# Patient Record
Sex: Female | Born: 1985 | Race: White | Hispanic: No | Smoking: Former smoker
Health system: Southern US, Community
[De-identification: ages and names within clinical notes are randomized; demographics above are authoritative.]

## PROBLEM LIST (undated history)

## (undated) DIAGNOSIS — F419 Anxiety disorder, unspecified: Secondary | ICD-10-CM

## (undated) DIAGNOSIS — F32A Depression, unspecified: Secondary | ICD-10-CM

## (undated) DIAGNOSIS — F329 Major depressive disorder, single episode, unspecified: Secondary | ICD-10-CM

## (undated) DIAGNOSIS — R51 Headache: Secondary | ICD-10-CM

## (undated) HISTORY — DX: Headache: R51

## (undated) HISTORY — DX: Anxiety disorder, unspecified: F41.9

## (undated) HISTORY — DX: Major depressive disorder, single episode, unspecified: F32.9

## (undated) HISTORY — DX: Depression, unspecified: F32.A

---

## 2014-01-10 LAB — HM PAP SMEAR: HM Pap smear: NEGATIVE

## 2014-01-14 ENCOUNTER — Encounter (INDEPENDENT_AMBULATORY_CARE_PROVIDER_SITE_OTHER): Payer: Self-pay

## 2014-01-14 ENCOUNTER — Encounter (HOSPITAL_COMMUNITY): Payer: Self-pay | Admitting: Psychiatry

## 2014-01-14 ENCOUNTER — Ambulatory Visit (INDEPENDENT_AMBULATORY_CARE_PROVIDER_SITE_OTHER): Payer: 59 | Admitting: Psychiatry

## 2014-01-14 VITALS — BP 121/72 | HR 78 | Ht 66.0 in | Wt 160.0 lb

## 2014-01-14 DIAGNOSIS — F329 Major depressive disorder, single episode, unspecified: Secondary | ICD-10-CM | POA: Insufficient documentation

## 2014-01-14 DIAGNOSIS — F419 Anxiety disorder, unspecified: Secondary | ICD-10-CM

## 2014-01-14 DIAGNOSIS — F32A Depression, unspecified: Secondary | ICD-10-CM | POA: Insufficient documentation

## 2014-01-14 DIAGNOSIS — F341 Dysthymic disorder: Secondary | ICD-10-CM

## 2014-01-14 DIAGNOSIS — F331 Major depressive disorder, recurrent, moderate: Secondary | ICD-10-CM | POA: Insufficient documentation

## 2014-01-14 MED ORDER — ESCITALOPRAM OXALATE 10 MG PO TABS: 15.0000 mg | ORAL_TABLET | Freq: Every day | ORAL | Status: DC
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
## 2014-01-15 ENCOUNTER — Telehealth (HOSPITAL_COMMUNITY): Payer: Self-pay

## 2014-01-15 NOTE — Telephone Encounter (Signed)
Called informed for a 30 day supply on the med.

## 2014-01-30 ENCOUNTER — Ambulatory Visit (HOSPITAL_COMMUNITY): Payer: 59 | Admitting: Professional Counselor

## 2014-02-11 ENCOUNTER — Ambulatory Visit (HOSPITAL_COMMUNITY): Payer: 59 | Admitting: Psychiatry

## 2014-02-17 ENCOUNTER — Encounter (HOSPITAL_COMMUNITY): Payer: Self-pay | Admitting: Professional Counselor

## 2014-02-17 ENCOUNTER — Ambulatory Visit (INDEPENDENT_AMBULATORY_CARE_PROVIDER_SITE_OTHER): Payer: 59 | Admitting: Professional Counselor

## 2014-02-17 DIAGNOSIS — F331 Major depressive disorder, recurrent, moderate: Secondary | ICD-10-CM

## 2014-02-17 DIAGNOSIS — F411 Generalized anxiety disorder: Secondary | ICD-10-CM

## 2014-02-17 NOTE — Progress Notes (Signed)
Patient:   Suzanne Stephenson   DOB:   09-15-1985  MR Number:  [id number]  Location:  [place] [address] Dept: [phone]           Date of Service:   02/17/2014   Start Time:   10:00am End Time:   10:50am  Provider/Observer:  [place] [employer]       Billing Code/Service: [phone]  Chief Complaint:     Chief Complaint  Patient presents with  . Anxiety  . Establish Care    Reason for Service:  Patient was referred by her psychiatrist due to increased deperssion, anxiety, and stress.  Patient also reports history of ADHD.  Stressors include past events such as her divorce, loss of Aunt and grandmother, work-related stress, and family relational issues.  She reports she has had panic attacks in the past, but they have decreased to 1-2 per week.  She reports other behavioral responses include poor concentration, poor focus, loss of thoughts/train of thought, and poor sleep. She is seeking to establish therapy services at this time.  Current Status:  Suzanne Stephenson reports she has improved mood and stability due to medication adjustments and compliance. She reports she is neither unhappy or happy, but reports she is just going through the emotions and has learned to suppress/turn her emotions off and shut down. She reports this is not how she wants to live and wanting to regulate her emotions.  Reliability of Information: Self report, chart review  Behavioral Observation: Suzanne Stephenson  presents as a 5 y.o.-year-old Right Caucasian Female who appeared her stated age. her dress was Appropriate and she was Casual and her manners were Appropriate to the situation.  There were not any physical disabilities noted.  she displayed an appropriate level of cooperation and motivation.    Interactions:    Active   Attention:   abnormal - patient lost her thoughts several  times during assessment.  She reports she would forget what she was saying and change the subject.  Memory:   normal  Visuo-spatial:   normal  Speech (Volume):  normal  Speech:   normal pitch and normal volume  Thought Process:  Loose  Though Content:  WNL  Orientation:   person, place, time/date, situation and day of week  Judgment:   Good  Planning:   Fair  Affect:    Flat  Mood:    Depressed  Insight:   Good  Intelligence:   normal  Marital Status/Living: Patient reports she was married very young at the age of 61. She was married for three years and husband at the time was diagnosis ed with MS. She reports she was his caregiver up until he started seeing another woman and left patient. Patient divorced, was able to live independently for over a year until she met her current boyfriend.  She reports she is in a healthy relationship, lives with boyfriend in [place] and has 2 step children: 49 years old and 12 years old.  She reports she plans on getting married to current boyfriend, but at this time they are not ready.  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   Substance Use:  No concerns of substance abuse are reported.  Patient reports she smokes cigarettes daily, reports 4 or more a day and this helps  her anxiety. She reports she has tried marijuana in the past, however nothing current. Has drank alcohol in the past, will drink one alcoholic beverage a week, but no past history of substance abuse problems or abuse.     Patient reports her sister (youngest sister) has been in and out of rehab 4 or more times for a drug abuse problem. Patient was again the caregiver (emotional support) for sister through the duration of her drug problem. Sister is currently sober at this time.  Education:   HS Graduate and Completed [employer] school.  Hobbies:  Reading, being with friends,  going to the beach.  Legal: none  Religion: not affiliated  Military: None reported.  Medical History:   Past Medical History  Diagnosis Date  . Anxiety   . Depression   . YIFOYDXA(128.7)         Outpatient Encounter Prescriptions as of 02/17/2014  Medication Sig  . ALPRAZolam (XANAX) 0.5 MG tablet Take 0.5 mg by mouth.  . escitalopram (LEXAPRO) 10 MG tablet Take 1.5 tablets (15 mg total) by mouth daily.        Reports medications have helped her current anxiety and depression. Patient may benefit from a small stimulant to help the ADD (inattention type) AEB in session she lost her thoughts very easily, struggled focusing, struggled with concentration, sleep.  Hyperactivity was not noticed at this time as patient was calm and able to complete hour long assessment. She reports being overstimulated, not wanting to be around increased noise or people and being exhausted at the end of the day.   Sexual History:   History  Sexual Activity  . Sexual Activity: Yes    Abuse/Trauma History: None reported by patient within childhood or as and adult.  Psychiatric History:  Suzanne Stephenson reports she has had therapy as a child in the past for ADHD.  No previous hospitalizations.   Currently she is seeing a psychiatrist for medication management.    Family Med/Psych History:  Family History  Problem Relation Age of Onset  . Drug abuse Sister   . Alcohol abuse Maternal Aunt     Risk of Suicide/Violence: virtually non-existent Patient denies any suicidal ideation, plan or intent. Reports no previous thoughts of suicide. Positive outliers are her family, friends, boyfriend, and job.  Impression/DX:  Suzanne Stephenson is a 28 year old female who presents with flat affect and depressed mood. She reports she has been dealing with anxiety and depression since she was a young child 49 or 53 and at the age of 59 she was dx with ADHD.  She has been treated for medication in the past for ADHD, currently not  taking anything for ADHD, but is consistent with medication for anxiety and depression. She reports her anxiety comes from taking care of everyone and their problems and at a young age has learned to shut down her emotions and isolate. She reports she wants to change these behaviors because she feels that life is passing her by with little to no control. She admits to having panic attacks 1-2 times during the week and depression/anixety affects her daily function level due to being exhausted, over-stimulated by noise and people and not wanting to go out and enjoy herself with family and friends. She reports at one point she was afraid to leave the house due to feeling like she was allergic to everything causing her to have physical responses (hives and panic attacks).  She reports much of these behaviors have subsided, but she  wants to learn how to regulate her emotions as they associate with depression, ADHD, and anxiety. She also wants to learn how to communicate needing help instead of always taking on other people's problems.  Disposition/Plan:  Suzanne Stephenson meets criteria for individual counseling and is agreeable to come 1x a week for the next 3 weeks, up until LCSW goes out on maternity leave. She is not sure if she wants to continue therapy after this writer leaves, but is open to planning for a referral.  She will benefit from CBT and solution focused therapy.  She will continue seeing psychiatrist for medication management.   Diagnosis:    Axis I:  Major depressive disorder, recurrent episode, moderate  Generalized anxiety disorder   R/O ADHD      Axis II: Deferred                Armistead Sult, Evie Lacks, LCSW

## 2014-02-18 ENCOUNTER — Ambulatory Visit (INDEPENDENT_AMBULATORY_CARE_PROVIDER_SITE_OTHER): Payer: 59 | Admitting: Psychiatry

## 2014-02-18 ENCOUNTER — Encounter (INDEPENDENT_AMBULATORY_CARE_PROVIDER_SITE_OTHER): Payer: Self-pay

## 2014-02-18 VITALS — BP 123/83 | HR 95 | Ht 65.0 in | Wt 163.0 lb

## 2014-02-18 DIAGNOSIS — F331 Major depressive disorder, recurrent, moderate: Secondary | ICD-10-CM

## 2014-02-18 DIAGNOSIS — F411 Generalized anxiety disorder: Secondary | ICD-10-CM
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
## 2014-02-24 ENCOUNTER — Ambulatory Visit (HOSPITAL_COMMUNITY): Payer: 59 | Admitting: Professional Counselor

## 2014-03-04 ENCOUNTER — Encounter (HOSPITAL_COMMUNITY): Payer: Self-pay | Admitting: Professional Counselor

## 2014-03-04 ENCOUNTER — Ambulatory Visit (INDEPENDENT_AMBULATORY_CARE_PROVIDER_SITE_OTHER): Payer: 59 | Admitting: Professional Counselor

## 2014-03-04 ENCOUNTER — Encounter (INDEPENDENT_AMBULATORY_CARE_PROVIDER_SITE_OTHER): Payer: Self-pay

## 2014-03-04 DIAGNOSIS — F331 Major depressive disorder, recurrent, moderate: Secondary | ICD-10-CM

## 2014-03-04 NOTE — Progress Notes (Signed)
   THERAPIST PROGRESS NOTE  Session Time: 10:00am-10:55am  Participation Level: Active  Behavioral Response: CasualAlertAnxious  Type of Therapy: Individual Therapy  Treatment Goals addressed: Anxiety, Boundaries  Interventions: CBT and Solution Focused  Summary: Suzanne Stephenson is a 28 y.o. female who presents with anxious mood reporting a lot has been going on since her last visit in August.  Suzanne Stephenson begins session discussion relationship with boyfriend and lack of communication between the two causing feelings of frustration and agitation.  Patient is able to process and be educated regarding her struggle with communicating needs and feelings causing her boyfriend to not know how to help patient.  When discussing lack of communication, patient processes how her childhood past plays a role in how she deals with relationships today. She describes her learned behavior from her mother and father as they were not allowed to "feel angry" or upset, thus when she does feel these things she feels guilty or suppresses the thoughts causing mental exhaustion. LCSW used CBT as a way to make patient more aware of her absolute and obsessive thoughts and change the outcomes with positive indicators in she were able to discuss her needs. Interventions were also discussed such as having an afternoon break to write down problems or thoughts, additional self care ideas such as turning off her phone, tv, and sit in stillness for 1 minute and work her way up to 5 minutes.  Patient discusses how she feels overstimulated all day and lacks sleep at night in which LCSW discussed positive sleep hygenic techniques.   Suicidal/Homicidal: Nowithout intent/plan  Therapist Response: Suzanne Stephenson has only been seen twice due to late cancel last week, but beginning to open up about how her past learned behaviors of suppressing emotion has followed her into adulthood causing negative outcomes.  She makes excuses for others even  when she is angry or frustrated as a way to cope and not have to show emotion or be vulnerable.  She is able to see how this will affect future relationships, but ultimately her happiness in life.  She continues to find ways to cope and process her feelings and is open to change, however her barriers remain asking for helping and setting boundaries for self.  Part of her anxiety and depression stem from her ADHD in which she struggles processing different thoughts and feeling lack of control in her life that she becomes obsessed with controlling others and self that she exhaust herself.  Through different examples she is able to come up with different outcomes in approaching people and sharing her feelings, rather than letting problems go or going throughout the day without feeling anything.  Plan: This is patient's last appointment with therapist due to maternity leave. Patient is willing to wait for new therapist in [facility] instead of being referred out. Patient will continue to see Psych MD for medication in [place].   Diagnosis: Axis I: Major Depression, single episode  ADHD inattentive type    Axis II: Deferred    Suzanne Sorrow, LCSW 03/04/2014

## 2014-03-11 ENCOUNTER — Ambulatory Visit (INDEPENDENT_AMBULATORY_CARE_PROVIDER_SITE_OTHER): Payer: 59 | Admitting: Psychiatry

## 2014-03-11 ENCOUNTER — Encounter (INDEPENDENT_AMBULATORY_CARE_PROVIDER_SITE_OTHER): Payer: Self-pay

## 2014-03-11 ENCOUNTER — Encounter (HOSPITAL_COMMUNITY): Payer: Self-pay | Admitting: Psychiatry

## 2014-03-11 VITALS — BP 112/75 | HR 95 | Ht 66.0 in | Wt 162.0 lb

## 2014-03-11 DIAGNOSIS — F411 Generalized anxiety disorder: Secondary | ICD-10-CM

## 2014-03-11 DIAGNOSIS — F331 Major depressive disorder, recurrent, moderate: Secondary | ICD-10-CM

## 2014-03-11 DIAGNOSIS — F900 Attention-deficit hyperactivity disorder, predominantly inattentive type: Secondary | ICD-10-CM

## 2014-03-11 MED ORDER — ESCITALOPRAM OXALATE 10 MG PO TABS: 15.0000 mg | ORAL_TABLET | Freq: Every day | ORAL | Status: DC

## 2014-03-11 MED ORDER — AMPHETAMINE-DEXTROAMPHET ER 5 MG PO CP24: 5.0000 mg | ORAL_CAPSULE | ORAL | Status: DC
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
## 2014-03-17 ENCOUNTER — Ambulatory Visit (HOSPITAL_COMMUNITY): Payer: 59 | Admitting: Psychiatry

## 2014-04-08 ENCOUNTER — Ambulatory Visit (INDEPENDENT_AMBULATORY_CARE_PROVIDER_SITE_OTHER): Payer: 59 | Admitting: Psychiatry

## 2014-04-08 ENCOUNTER — Encounter (HOSPITAL_COMMUNITY): Payer: Self-pay | Admitting: Psychiatry

## 2014-04-08 VITALS — BP 124/84 | HR 77 | Wt 160.0 lb

## 2014-04-08 DIAGNOSIS — F331 Major depressive disorder, recurrent, moderate: Secondary | ICD-10-CM

## 2014-04-08 DIAGNOSIS — F419 Anxiety disorder, unspecified: Secondary | ICD-10-CM

## 2014-04-08 DIAGNOSIS — F411 Generalized anxiety disorder: Secondary | ICD-10-CM

## 2014-04-08 DIAGNOSIS — F900 Attention-deficit hyperactivity disorder, predominantly inattentive type: Secondary | ICD-10-CM

## 2014-04-08 MED ORDER — AMPHETAMINE-DEXTROAMPHET ER 10 MG PO CP24: 10.0000 mg | ORAL_CAPSULE | ORAL | Status: DC

## 2014-04-08 MED ORDER — ESCITALOPRAM OXALATE 10 MG PO TABS: 15.0000 mg | ORAL_TABLET | Freq: Every day | ORAL | Status: DC
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
## 2014-05-06 ENCOUNTER — Encounter (HOSPITAL_COMMUNITY): Payer: Self-pay | Admitting: Psychiatry

## 2014-05-06 ENCOUNTER — Ambulatory Visit (INDEPENDENT_AMBULATORY_CARE_PROVIDER_SITE_OTHER): Payer: 59 | Admitting: Psychiatry

## 2014-05-06 VITALS — BP 110/74 | HR 96 | Ht 66.0 in | Wt 164.0 lb

## 2014-05-06 DIAGNOSIS — F419 Anxiety disorder, unspecified: Secondary | ICD-10-CM

## 2014-05-06 DIAGNOSIS — F900 Attention-deficit hyperactivity disorder, predominantly inattentive type: Secondary | ICD-10-CM

## 2014-05-06 DIAGNOSIS — F411 Generalized anxiety disorder: Secondary | ICD-10-CM

## 2014-05-06 DIAGNOSIS — F331 Major depressive disorder, recurrent, moderate: Secondary | ICD-10-CM

## 2014-05-06 MED ORDER — AMPHETAMINE-DEXTROAMPHET ER 10 MG PO CP24: 10.0000 mg | ORAL_CAPSULE | ORAL | Status: DC

## 2014-05-06 MED ORDER — ESCITALOPRAM OXALATE 10 MG PO TABS: 15.0000 mg | ORAL_TABLET | Freq: Every day | ORAL | Status: DC
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
## 2014-06-06 ENCOUNTER — Telehealth (HOSPITAL_COMMUNITY): Payer: Self-pay | Admitting: *Deleted

## 2014-06-06 ENCOUNTER — Telehealth (HOSPITAL_COMMUNITY): Payer: Self-pay | Admitting: Psychiatry

## 2014-06-06 MED ORDER — AMPHETAMINE-DEXTROAMPHET ER 10 MG PO CP24: 10.0000 mg | ORAL_CAPSULE | ORAL | Status: DC

## 2014-06-06 NOTE — Telephone Encounter (Signed)
Pt needs a prescription written for Adderall XR 10mg . Pt has an appt with Dr. Gilmore LarocheAkhtar on 1/5.

## 2014-06-06 NOTE — Telephone Encounter (Signed)
adderall prescription printed.

## 2014-07-01 ENCOUNTER — Ambulatory Visit (HOSPITAL_COMMUNITY): Payer: 59 | Admitting: Psychiatry

## 2014-07-08 ENCOUNTER — Encounter (INDEPENDENT_AMBULATORY_CARE_PROVIDER_SITE_OTHER): Payer: Self-pay

## 2014-07-08 ENCOUNTER — Encounter (HOSPITAL_COMMUNITY): Payer: Self-pay | Admitting: Psychiatry

## 2014-07-08 ENCOUNTER — Ambulatory Visit (INDEPENDENT_AMBULATORY_CARE_PROVIDER_SITE_OTHER): Payer: 59 | Admitting: Psychiatry

## 2014-07-08 VITALS — BP 118/75 | HR 80 | Ht 65.0 in | Wt 157.0 lb

## 2014-07-08 DIAGNOSIS — F331 Major depressive disorder, recurrent, moderate: Secondary | ICD-10-CM

## 2014-07-08 DIAGNOSIS — F900 Attention-deficit hyperactivity disorder, predominantly inattentive type: Secondary | ICD-10-CM

## 2014-07-08 DIAGNOSIS — F419 Anxiety disorder, unspecified: Secondary | ICD-10-CM

## 2014-07-08 DIAGNOSIS — F411 Generalized anxiety disorder: Secondary | ICD-10-CM

## 2014-07-08 MED ORDER — ESCITALOPRAM OXALATE 10 MG PO TABS: 15.0000 mg | ORAL_TABLET | Freq: Every day | ORAL | Status: DC

## 2014-07-08 MED ORDER — AMPHETAMINE-DEXTROAMPHET ER 10 MG PO CP24: 10.0000 mg | ORAL_CAPSULE | ORAL | Status: DC
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
## 2014-08-04 ENCOUNTER — Telehealth (HOSPITAL_COMMUNITY): Payer: Self-pay | Admitting: *Deleted

## 2014-08-04 NOTE — Telephone Encounter (Signed)
Pt need a script for Adderall XR 10mg . Pt will pickup script on Friday after lunch.

## 2014-08-05 MED ORDER — AMPHETAMINE-DEXTROAMPHET ER 10 MG PO CP24: 10.0000 mg | ORAL_CAPSULE | ORAL | Status: DC

## 2014-08-05 NOTE — Telephone Encounter (Signed)
adderall prescription printed for pick up 

## 2014-09-09 ENCOUNTER — Encounter (HOSPITAL_COMMUNITY): Payer: Self-pay | Admitting: Psychiatry

## 2014-09-09 ENCOUNTER — Ambulatory Visit (INDEPENDENT_AMBULATORY_CARE_PROVIDER_SITE_OTHER): Payer: 59 | Admitting: Psychiatry

## 2014-09-09 VITALS — BP 118/87 | HR 100 | Ht 65.0 in | Wt 158.0 lb

## 2014-09-09 DIAGNOSIS — F900 Attention-deficit hyperactivity disorder, predominantly inattentive type: Secondary | ICD-10-CM

## 2014-09-09 DIAGNOSIS — F331 Major depressive disorder, recurrent, moderate: Secondary | ICD-10-CM

## 2014-09-09 DIAGNOSIS — F411 Generalized anxiety disorder: Secondary | ICD-10-CM

## 2014-09-09 MED ORDER — AMPHETAMINE-DEXTROAMPHET ER 10 MG PO CP24: 10.0000 mg | ORAL_CAPSULE | ORAL | Status: DC

## 2014-09-09 MED ORDER — ESCITALOPRAM OXALATE 10 MG PO TABS: 15.0000 mg | ORAL_TABLET | Freq: Every day | ORAL | Status: DC

## 2014-09-09 NOTE — Progress Notes (Signed)
Patient ID: Suzanne Stephenson, female   DOB: [birth year], 29 y.o.   MRN: [id number]  [facility]  Suzanne Stephenson [id number] 28 y.o.  09/09/2014  Chief Complaint: depression and inattention.     History of Present Illness:   Patient returns for Medication Follow up and is diagnosed with depression.   Aggravating factors of depression; loss and grief her grandma died nearly 2014y after that her aunt died. She also has gone through a divorced 2013 . He had multiple sclerosis she took good care of him and was behaving as a caregiver despite that he left one day said that he will be with another woman. She has developed some closure about it and feels like she can move forward. Now has a boyfriend and his 2 sons.   She is now on lexapro 15mg  and adderall 10mg .  She is responding better her depression is better she is walking about side keeping herself physically active. She's trying to do her balance and not overly get involved in caregiving activities.there is no side effects. She did have a difficult holiday season. One of her colleague died and holidays were stressful. Spring weather is helping. Some distraction at work because of loud colleague. She does take some drug holidays on adderall.  No palpitations or side effects. 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  Duration of depression; on and off since age 29  Severity of depression; 8/10. 10 being very happy  Context: Losses in family. No closure to past divorce  Timing: variable  Severity of anxiety; 4/10. 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    Past Medical History   Diagnosis Date  . Anxiety   . Depression   . Headache(784.0)    Family History  Problem Relation Age of Onset  . Drug abuse Sister   . Alcohol abuse Maternal Aunt     Outpatient Encounter Prescriptions as of 09/09/2014  Medication Sig  . amphetamine-dextroamphetamine (ADDERALL XR) 10 MG 24 hr capsule Take 1 capsule (10 mg total) by mouth every morning.  . escitalopram (LEXAPRO) 10 MG tablet Take 1.5 tablets (15 mg total) by mouth daily.  . [DISCONTINUED] amphetamine-dextroamphetamine (ADDERALL XR) 10 MG 24 hr capsule Take 1 capsule (10 mg total) by mouth every morning.  . [DISCONTINUED] escitalopram (LEXAPRO) 10 MG tablet Take 1.5 tablets (15 mg total) by mouth daily.    No results found for this or any previous visit (from the past 2160 hour(s)).  BP 118/87 mmHg  Pulse 100  Ht 5\' 5"  (1.651 m)  Wt 158 lb (71.668 kg)  BMI 26.29 kg/m2   Review of Systems  Constitutional: Negative.   Musculoskeletal: Negative for myalgias.  Skin: Negative for rash.  Psychiatric/Behavioral: Negative for depression and suicidal ideas. The patient is not nervous/anxious.     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 Rule out ADHD  Treatment Plan:   Continue lexapro and adderall.   Denies substance abuse.  She feels holiday was stressful and no med increase is needed.  Pertinent Labs and Relevant Prior Notes reviewed. Medication Side effects, benefits and risks reviewed/discussed with Patient. Time given for patient to respond  and asks questions regarding the Diagnosis and Medications. Safety concerns and to report to ER if suicidal or call 911. Relevant Medications refilled or called in to pharmacy. Discussed weight maintenance and Sleep Hygiene. Follow up with Primary care provider in regards to Medical conditions. Recommend compliance with medications and follow up office appointments. Discussed to avail opportunity to consider or/and continue Individual therapy with Counselor. Greater than 50% of time was spend in counseling and coordination of care with the patient.  Schedule for Follow up visit in 8  weeks or call in earlier as necessary.  Thresa Ross, MD 09/09/2014

## 2014-09-28 ENCOUNTER — Emergency Department
Admission: EM | Admit: 2014-09-28 | Discharge: 2014-09-28 | Disposition: A | Discharge status: Home / Self Care | Payer: 59 | Source: Home / Self Care | Attending: Emergency Medicine | Admitting: Emergency Medicine

## 2014-09-28 ENCOUNTER — Encounter: Payer: Self-pay | Admitting: Emergency Medicine

## 2014-09-28 DIAGNOSIS — M545 Low back pain, unspecified: Secondary | ICD-10-CM

## 2014-09-28 DIAGNOSIS — M542 Cervicalgia: Secondary | ICD-10-CM

## 2014-09-28 MED ORDER — DIAZEPAM 5 MG PO TABS: 5.0000 mg | ORAL_TABLET | Freq: Two times a day (BID) | ORAL | Status: DC

## 2014-09-28 MED ORDER — HYDROCODONE-ACETAMINOPHEN 5-325 MG PO TABS: 2.0000 | ORAL_TABLET | ORAL | Status: DC | PRN

## 2014-09-28 NOTE — ED Notes (Signed)
Reports increasing pain and stiffness in neck, shoulders and lower back over past week; no known injury/movement causing this. 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

## 2014-09-28 NOTE — ED Provider Notes (Signed)
CSN: [id number]     Arrival date & time 09/28/14  1108 History   First MD Initiated Contact with Patient 09/28/14 1121     Chief Complaint  Patient presents with  . Neck Pain   (Consider location/radiation/quality/duration/timing/severity/associated sxs/prior Treatment) Patient is a 29 y.o. female presenting with neck pain. The history is provided by the patient. No language interpreter was used.  Neck Pain Pain location:  Generalized neck Quality:  Aching Pain radiates to:  Does not radiate Pain severity:  Moderate Pain is:  Unable to specify Onset quality:  Gradual Duration:  1 week Timing:  Constant Progression:  Worsening Chronicity:  New Relieved by:  Nothing Worsened by:  Nothing tried Ineffective treatments:  None tried Associated symptoms: paresis   Risk factors: no hx of spinal trauma     Past Medical History  Diagnosis Date  . Anxiety   . Depression   . Headache(784.0)    History reviewed. No pertinent past surgical history. Family History  Problem Relation Age of Onset  . Drug abuse Sister   . Alcohol abuse Maternal Aunt    History  Substance Use Topics  . Smoking status: Current Every Day Smoker -- 0.50 packs/day for 5 years    Types: Cigarettes  . Smokeless tobacco: Current User  . Alcohol Use: Yes   OB History    No data available     Review of Systems  Musculoskeletal: Positive for neck pain.  All other systems reviewed and are negative.   Allergies  Review of patient's allergies indicates no known allergies.  Home Medications   Prior to Admission medications   Medication Sig Start Date End Date Taking? Authorizing Provider  amphetamine-dextroamphetamine (ADDERALL XR) 10 MG 24 hr capsule Take 1 capsule (10 mg total) by mouth every morning. 09/09/14   Thresa Ross, MD  diazepam (VALIUM) 5 MG tablet Take 1 tablet (5 mg total) by mouth 2 (two) times daily. 09/28/14   Elson Areas, PA-C  escitalopram (LEXAPRO) 10 MG tablet Take 1.5 tablets (15  mg total) by mouth daily. 09/09/14 09/09/15  Thresa Ross, MD  HYDROcodone-acetaminophen (NORCO/VICODIN) 5-325 MG per tablet Take 2 tablets by mouth every 4 (four) hours as needed. 09/28/14   Elson Areas, PA-C   BP 111/76 mmHg  Pulse 106  Temp(Src) 98.3 F (36.8 C) (Oral)  Resp 16  Ht 5' 5.5" (1.664 m)  Wt 150 lb (68.04 kg)  BMI 24.57 kg/m2  SpO2 100%  LMP 09/14/2014 (Approximate) Physical Exam  Constitutional: She is oriented to person, place, and time. She appears well-developed and well-nourished.  HENT:  Head: Normocephalic.  Eyes: EOM are normal.  Neck: Normal range of motion.  Pulmonary/Chest: Effort normal.  Abdominal: She exhibits no distension.  Musculoskeletal: Normal range of motion.  Tender cervical spine,  Pain with range of motion,  Tender full thoracic and lumbar spine.   dtrs equal and present all extremities,  nv and ns intact,  vegative straight leg raise  Neurological: She is alert and oriented to person, place, and time. She has normal reflexes.  Skin: Skin is warm.  Psychiatric: She has a normal mood and affect.  Nursing note and vitals reviewed.  Pt reports no relief with robaxin, meloxicam, ibuprofen or voltaren.  Pt does not want to take prednisone.  Pt reports it makes her feel terrible.  ED Course  Procedures (including critical care time) Labs Review Labs Reviewed - No data to display  Imaging Review No results found.  I reviewed narcotics data base.  No drug abuse pattern   No diagnosis found. Valium Hydrocodone Schedule to see Dr. Karie Schwalbe for evaluation AVS    Elson AreasLeslie K Rukia Mcgillivray, PA-C 09/28/14 1249

## 2014-09-28 NOTE — Discharge Instructions (Signed)

## 2014-10-07 ENCOUNTER — Telehealth (HOSPITAL_COMMUNITY): Payer: Self-pay

## 2014-10-07 MED ORDER — AMPHETAMINE-DEXTROAMPHET ER 10 MG PO CP24: 10.0000 mg | ORAL_CAPSULE | ORAL | Status: DC

## 2014-10-07 NOTE — Telephone Encounter (Signed)
adderall prescription printed for patient to pick up.

## 2014-10-07 NOTE — Telephone Encounter (Signed)
PT needs a refill on Adderral

## 2014-11-04 ENCOUNTER — Encounter (HOSPITAL_COMMUNITY): Payer: Self-pay | Admitting: Psychiatry

## 2014-11-04 ENCOUNTER — Ambulatory Visit (INDEPENDENT_AMBULATORY_CARE_PROVIDER_SITE_OTHER): Payer: 59 | Admitting: Psychiatry

## 2014-11-04 VITALS — BP 114/68 | HR 83 | Ht 65.0 in | Wt 155.0 lb

## 2014-11-04 DIAGNOSIS — F900 Attention-deficit hyperactivity disorder, predominantly inattentive type: Secondary | ICD-10-CM

## 2014-11-04 DIAGNOSIS — F411 Generalized anxiety disorder: Secondary | ICD-10-CM | POA: Diagnosis not present

## 2014-11-04 DIAGNOSIS — F419 Anxiety disorder, unspecified: Secondary | ICD-10-CM | POA: Diagnosis not present

## 2014-11-04 DIAGNOSIS — F909 Attention-deficit hyperactivity disorder, unspecified type: Secondary | ICD-10-CM | POA: Diagnosis not present

## 2014-11-04 DIAGNOSIS — F331 Major depressive disorder, recurrent, moderate: Secondary | ICD-10-CM | POA: Diagnosis not present

## 2014-11-04 MED ORDER — AMPHETAMINE-DEXTROAMPHET ER 10 MG PO CP24: ORAL_CAPSULE | ORAL | Status: DC

## 2014-11-04 MED ORDER — ESCITALOPRAM OXALATE 10 MG PO TABS: 15.0000 mg | ORAL_TABLET | Freq: Every day | ORAL | Status: DC

## 2014-11-04 MED ORDER — AMPHETAMINE-DEXTROAMPHET ER 10 MG PO CP24: 10.0000 mg | ORAL_CAPSULE | ORAL | Status: DC

## 2014-11-04 NOTE — Progress Notes (Signed)
Patient ID: Suzanne Stephenson, female   DOB: [birth year], 29 y.o.   MRN: [id number]  [facility]  Suzanne Stephenson [id number] 28 y.o.  11/04/2014  Chief Complaint: depression and inattention.     History of Present Illness:   Patient returns for Medication Follow up and is diagnosed with depression. ADHD. GAd  Aggravating factors of depression; loss and grief her grandma died nearly 2014y after that her aunt died. She also has gone through a divorced 2013 . He had multiple sclerosis she took good care of him and was behaving as a caregiver despite that he left one day said that he will be with another woman. She has developed some closure about it and feels like she can move forward. Now has a boyfriend and his 2 sons. Recent factor is step son not going to school has anxiety and they are dealing with custody issues.    She is now on lexapro 15mg  and adderall 10mg .  Some down days but not worse. Goes to gym. She's trying to do her balance and not overly get involved in caregiving activities.there is no side effects. She did have a difficult holiday season.  Spring weather is helping. Some distraction at work because of loud colleague. She does take some drug holidays on adderall.  No palpitations or side effects. 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  Duration of depression; on and off since age 29  Severity of depression; 7/10. 10 being very happy  Context: Losses in family. No closure to past divorce  Timing: variable  Severity of anxiety; 4/10. 10 being very anxious  Inattention stable on adderall.   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    Past  Medical History  Diagnosis Date  . Anxiety   . Depression   . Headache(784.0)    Family History  Problem Relation Age of Onset  . Drug abuse Sister   . Alcohol abuse Maternal Aunt     Outpatient Encounter Prescriptions as of 11/04/2014  Medication Sig  . amphetamine-dextroamphetamine (ADDERALL XR) 10 MG 24 hr capsule Fill on or after June 9th, 2016  . escitalopram (LEXAPRO) 10 MG tablet Take 1.5 tablets (15 mg total) by mouth daily.  . [DISCONTINUED] amphetamine-dextroamphetamine (ADDERALL XR) 10 MG 24 hr capsule Take 1 capsule (10 mg total) by mouth every morning.  . [DISCONTINUED] amphetamine-dextroamphetamine (ADDERALL XR) 10 MG 24 hr capsule Take 1 capsule (10 mg total) by mouth every morning.  . [DISCONTINUED] escitalopram (LEXAPRO) 10 MG tablet Take 1.5 tablets (15 mg total) by mouth daily.  . [DISCONTINUED] diazepam (VALIUM) 5 MG tablet Take 1 tablet (5 mg total) by mouth 2 (two) times daily. (Patient not taking: Reported on 11/04/2014)  . [DISCONTINUED] HYDROcodone-acetaminophen (NORCO/VICODIN) 5-325 MG per tablet Take 2 tablets by mouth every 4 (four) hours as needed. (Patient not taking: Reported on 11/04/2014)   No facility-administered encounter medications on file as of 11/04/2014.    No results found for this or any previous visit (from the past 2160 hour(s)).  BP 114/68 mmHg  Pulse 83  Ht 5\' 5"  (1.651 m)  Wt 155 lb (70.308 kg)  BMI 25.79 kg/m2  SpO2 95%  LMP 10/20/2014   Review of Systems  Cardiovascular: Negative for chest pain.  Skin: Negative for rash.  Psychiatric/Behavioral: Negative for suicidal ideas and substance abuse.    Mental Status Examination  Appearance: casual Alert: Yes Attention: fair  Cooperative: Yes Eye Contact: Fair Speech: normal Psychomotor Activity: Normal Memory/Concentration: adequate Oriented: person, place and time/date Mood: Euthymic Affect: Congruent Thought Processes and Associations: Coherent Fund of Knowledge:  Fair Thought Content: Suicidal ideation and Homicidal ideation were denied Insight: Fair Judgement: Fair  Diagnosis: 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 GAD.  ADHD  Treatment Plan:   Continue lexapro and adderall.  Prescriptions written Denies substance abuse.  She feels holiday was stressful and no med increase is needed.  Pertinent Labs and Relevant Prior Notes reviewed. Medication Side effects, benefits and risks reviewed/discussed with Patient. Time given for patient to respond and asks questions regarding the Diagnosis and Medications. Safety concerns and to report to ER if suicidal or call 911. Relevant Medications refilled or called in to pharmacy. Discussed weight maintenance and Sleep Hygiene. Follow up with Primary care provider in regards to Medical conditions. Recommend compliance with medications and follow up office appointments. Discussed to avail opportunity to consider or/and continue Individual therapy with Counselor. Greater than 50% of time was spend in counseling and coordination of care with the patient, this includes custody concerns and dealing with her young step son.  Schedule for Follow up visit in 8  weeks or call in earlier as necessary.  Thresa RossAKHTAR, Quasim Doyon, MD 11/04/2014

## 2014-12-31 ENCOUNTER — Telehealth (HOSPITAL_COMMUNITY): Payer: Self-pay | Admitting: *Deleted

## 2014-12-31 MED ORDER — ESCITALOPRAM OXALATE 10 MG PO TABS: 15.0000 mg | ORAL_TABLET | Freq: Every day | ORAL | Status: DC

## 2014-12-31 NOTE — Telephone Encounter (Signed)
Pt called for a refill for Lexapro 10mg . Per Dr. Gilmore LarocheAkhtar, pt is authorized for a refill Lexapro 10mg , Qty 45. [facility]. Pt has a f/u appt on 7/12. Called and informed pt of prescription status.Pt verbalizes understanding.

## 2015-01-05 ENCOUNTER — Ambulatory Visit (HOSPITAL_COMMUNITY): Payer: Self-pay | Admitting: Psychiatry

## 2015-01-06 ENCOUNTER — Encounter (HOSPITAL_COMMUNITY): Payer: Self-pay | Admitting: Psychiatry

## 2015-01-06 ENCOUNTER — Ambulatory Visit (INDEPENDENT_AMBULATORY_CARE_PROVIDER_SITE_OTHER): Payer: 59 | Admitting: Psychiatry

## 2015-01-06 VITALS — BP 114/66 | HR 109 | Ht 65.0 in | Wt 158.0 lb

## 2015-01-06 DIAGNOSIS — F419 Anxiety disorder, unspecified: Secondary | ICD-10-CM

## 2015-01-06 DIAGNOSIS — F411 Generalized anxiety disorder: Secondary | ICD-10-CM

## 2015-01-06 DIAGNOSIS — F331 Major depressive disorder, recurrent, moderate: Secondary | ICD-10-CM | POA: Diagnosis not present

## 2015-01-06 DIAGNOSIS — F900 Attention-deficit hyperactivity disorder, predominantly inattentive type: Secondary | ICD-10-CM | POA: Diagnosis not present

## 2015-01-06 MED ORDER — AMPHETAMINE-DEXTROAMPHET ER 10 MG PO CP24: ORAL_CAPSULE | ORAL | Status: DC

## 2015-01-06 MED ORDER — ESCITALOPRAM OXALATE 10 MG PO TABS: 15.0000 mg | ORAL_TABLET | Freq: Every day | ORAL | Status: DC

## 2015-01-06 NOTE — Progress Notes (Signed)
Patient ID: Suzanne Stephenson, female   DOB: 1986-03-04, 29 y.o.   MRN: [id number]  [facility] Follow-up Outpatient Visit  Suzanne Stephenson [id number] 28 y.o.  01/06/2015  Chief Complaint: depression and inattention.     History of Present Illness:   Patient returns for Medication Follow up and is diagnosed with depression. ADHD. GAd  Aggravating factors of depression; loss and grief her grandma died nearly 2014y after that her aunt died. She also has gone through a divorced 2013 . He had multiple sclerosis she took good care of him and was behaving as a caregiver despite that he left one day said that he will be with another woman. She has developed some closure about it and feels like she can move forward. Now has a boyfriend and his 2 sons.  Recent factor is step son not going to school has anxiety and they are dealing with custody issues.   She is on lexapro  and adderall .  Some down days but not worse. Goes to gym. She's trying to do her balance and not overly get involved in caregiving activities.there is no side effects. Went to [employer] with family.  She is working 2 to 3 jobs at times feels inattentive She does take some drug holidays on adderall.  No palpitations or side effects. Pulse is 107/min rechecked. Says she had coffee and flonase nasal spray that may have caused it.  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  Duration of depression; on and off since age 62  Severity of depression; 7/10. 10 being very happy  Context: Losses in family. No closure to past divorce  Timing: variable  Severity of anxiety; 4/10. 10 being very anxious  Inattention stable on adderall.   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    Past Medical History  Diagnosis Date  . Anxiety   . Depression   . Headache(784.0)    Family History  Problem Relation Age of Onset  . Drug abuse Sister   . Alcohol abuse Maternal Aunt     Outpatient Encounter Prescriptions as of 01/06/2015  Medication Sig  . amphetamine-dextroamphetamine (ADDERALL XR) 10 MG 24 hr capsule Fill on or after February 04, 2015  . escitalopram (LEXAPRO) 10 MG tablet Take 1.5 tablets (15 mg total) by mouth daily.  . [DISCONTINUED] amphetamine-dextroamphetamine (ADDERALL XR) 10 MG 24 hr capsule Fill on or after June 9th, 2016  . [DISCONTINUED] amphetamine-dextroamphetamine (ADDERALL XR) 10 MG 24 hr capsule Take one a day  . [DISCONTINUED] escitalopram (LEXAPRO) 10 MG tablet Take 1.5 tablets (15 mg total) by mouth daily.   No facility-administered encounter medications on file as of 01/06/2015.    No results found for this or any previous visit (from the past 2160 hour(s)).  BP 114/66 mmHg  Pulse 109  Ht  (1.651 m)  Wt 158 lb (71.668 kg)  BMI 26.29 kg/m2  SpO2 97%   Review of Systems  Cardiovascular: Negative for chest pain.  Skin: Negative for itching.  Psychiatric/Behavioral: Negative for suicidal ideas and substance abuse.    Mental Status Examination  Appearance: casual Alert: Yes Attention: fair  Cooperative: Yes Eye Contact: Fair Speech: normal Psychomotor Activity: Normal Memory/Concentration: adequate Oriented: person,  place and time/date Mood: Euthymic 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 GAD.  ADHD  Treatment Plan:   Continue lexapro for depression, anxiety. and adderall for ADhd.. Prescriptions written Denies substance abuse.  She feels holiday was stressful and no med  increase is needed.  Pertinent Labs and Relevant Prior Notes reviewed. Medication Side effects, benefits and risks reviewed/discussed with Patient. Time given for patient to respond and asks questions regarding the Diagnosis and Medications. Safety concerns and to report to ER if suicidal or call 911. Relevant Medications refilled or called in to pharmacy.  Follow up with Primary care provider in regards to Medical conditions. Recommend compliance with medications and follow up office appointments. Discussed to avail opportunity to consider or/and continue Individual therapy with Counselor. Greater than 50% of time was spend in counseling and coordination of care with the patient, this includes custody concerns and dealing with her young step son.  Schedule for Follow up visit in 8  weeks or call in earlier as necessary.  Thresa RossAKHTAR, [name], MD 01/06/2015

## 2015-03-09 ENCOUNTER — Ambulatory Visit (HOSPITAL_COMMUNITY): Payer: Self-pay | Admitting: Psychiatry

## 2015-03-10 ENCOUNTER — Encounter (HOSPITAL_COMMUNITY): Payer: Self-pay | Admitting: Psychiatry

## 2015-03-10 ENCOUNTER — Ambulatory Visit (INDEPENDENT_AMBULATORY_CARE_PROVIDER_SITE_OTHER): Payer: 59 | Admitting: Psychiatry

## 2015-03-10 VITALS — BP 117/80 | HR 88 | Ht 65.0 in | Wt 158.0 lb

## 2015-03-10 DIAGNOSIS — F411 Generalized anxiety disorder: Secondary | ICD-10-CM | POA: Diagnosis not present

## 2015-03-10 DIAGNOSIS — F909 Attention-deficit hyperactivity disorder, unspecified type: Secondary | ICD-10-CM | POA: Diagnosis not present

## 2015-03-10 DIAGNOSIS — F331 Major depressive disorder, recurrent, moderate: Secondary | ICD-10-CM | POA: Diagnosis not present

## 2015-03-10 DIAGNOSIS — F900 Attention-deficit hyperactivity disorder, predominantly inattentive type: Secondary | ICD-10-CM

## 2015-03-10 MED ORDER — AMPHETAMINE-DEXTROAMPHET ER 10 MG PO CP24: ORAL_CAPSULE | ORAL | Status: DC

## 2015-03-10 MED ORDER — ESCITALOPRAM OXALATE 10 MG PO TABS: 15.0000 mg | ORAL_TABLET | Freq: Every day | ORAL | Status: DC

## 2015-03-10 NOTE — Progress Notes (Signed)
Patient ID: Suzanne Stephenson, female   DOB: 1986/05/31, 29 y.o.   MRN: [id number]  [facility]  Suzanne Stephenson [id number] 28 y.o.  03/10/2015  Chief Complaint: depression and inattention.     History of Present Illness:   Patient returns for Medication Follow up and is diagnosed with depression. ADHD. GAd  Aggravating factors of depression; loss and grief her grandma died nearly 2014y after that her aunt died. She also has gone through a divorced 2013 . He had multiple sclerosis she took good care of him and was behaving as a caregiver despite that he left one day said that he will be with another woman. She has developed some closure about it and feels like she can move forward. Now has a boyfriend and his 2 sons. Gets ovewhelmed with household chores and keeping her job   Depression: reasonable and not worse on lexapro ADHD: She is working 2 to 3 jobs at times feels inattentive. meds help GAD: not worse. lexapro helps. She does take some drug holidays on adderall.  No palpitations or side effects.  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  Duration of depression; on and off since age 80  Severity of depression; 7/10. 10 being very happy  Context: Losses in family. No closure to past divorce  Timing: variable  Severity of anxiety; 4/10. 10 being very anxious  Inattention stable on adderall.   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    Past Medical History  Diagnosis Date  . Anxiety   . Depression   . Headache(784.0)    Family History  Problem Relation Age of Onset  . Drug abuse Sister   . Alcohol abuse Maternal Aunt     Outpatient Encounter Prescriptions as of  03/10/2015  Medication Sig  . amphetamine-dextroamphetamine (ADDERALL XR) 10 MG 24 hr capsule Take one a day.  . amphetamine-dextroamphetamine (ADDERALL XR) 10 MG 24 hr capsule Fill on or after October 2016  . escitalopram (LEXAPRO) 10 MG tablet Take 1.5 tablets (15 mg total) by mouth daily.  . [DISCONTINUED] amphetamine-dextroamphetamine (ADDERALL XR) 10 MG 24 hr capsule Fill on or after February 04, 2015  . [DISCONTINUED] escitalopram (LEXAPRO) 10 MG tablet Take 1.5 tablets (15 mg total) by mouth daily.   No facility-administered encounter medications on file as of 03/10/2015.    No results found for this or any previous visit (from the past 2160 hour(s)).  Ht  (1.651 m)  Wt 158 lb (71.668 kg)  BMI 26.29 kg/m2  BP 110/70 Pulse 88   Review of Systems  Cardiovascular: Negative for chest pain and palpitations.  Skin: Negative for itching.  Psychiatric/Behavioral: Negative for suicidal ideas, hallucinations and substance abuse.    Mental Status Examination  Appearance: casual Alert: Yes Attention: fair  Cooperative: Yes Eye Contact: Fair Speech: normal Psychomotor Activity: Normal Memory/Concentration: adequate Oriented: person, place and time/date Mood: Euthymic 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 GAD.  ADHD  Treatment Plan:   Continue lexapro for depression, anxiety. and adderall for ADhd.. Prescriptions written  Denies substance abuse.  Advised to take drug holidays for adderall. Monitor her BP frequently.  Pertinent Labs and Relevant Prior Notes reviewed. Medication Side effects, benefits and risks reviewed/discussed with Patient. Time given for patient to respond and asks questions regarding the Diagnosis and Medications. Safety  concerns and to report to ER if suicidal or call 911. Relevant Medications refilled or called in to pharmacy.  Follow up with Primary care provider in regards to Medical conditions. Recommend compliance with medications and follow up office appointments. Discussed to avail opportunity to consider or/and continue Individual therapy with Counselor. Greater than 50% of time was spend in counseling and coordination of care with the patient, this includes custody concerns and dealing with her young step son.  Schedule for Follow up visit in 8  weeks or call in earlier as necessary.  Thresa Ross, MD 03/10/2015

## 2015-04-13 ENCOUNTER — Telehealth (HOSPITAL_COMMUNITY): Payer: Self-pay | Admitting: *Deleted

## 2015-04-13 MED ORDER — AMPHETAMINE-DEXTROAMPHET ER 10 MG PO CP24: ORAL_CAPSULE | ORAL | Status: DC

## 2015-04-13 NOTE — Telephone Encounter (Signed)
Pt called for a new prescription. Pt states she could not fill prescription at pharmacy because the directions were missing. Please rewrite the prescription.Pt will pick up prescription after lunch.

## 2015-04-13 NOTE — Telephone Encounter (Signed)
adderall printed for pick up 

## 2015-05-11 ENCOUNTER — Ambulatory Visit (INDEPENDENT_AMBULATORY_CARE_PROVIDER_SITE_OTHER): Payer: 59 | Admitting: Psychiatry

## 2015-05-11 ENCOUNTER — Encounter (HOSPITAL_COMMUNITY): Payer: Self-pay | Admitting: Psychiatry

## 2015-05-11 VITALS — BP 112/68 | HR 86 | Ht 65.0 in | Wt 162.0 lb

## 2015-05-11 DIAGNOSIS — F331 Major depressive disorder, recurrent, moderate: Secondary | ICD-10-CM | POA: Diagnosis not present

## 2015-05-11 DIAGNOSIS — F900 Attention-deficit hyperactivity disorder, predominantly inattentive type: Secondary | ICD-10-CM

## 2015-05-11 DIAGNOSIS — F411 Generalized anxiety disorder: Secondary | ICD-10-CM

## 2015-05-11 MED ORDER — AMPHETAMINE-DEXTROAMPHET ER 10 MG PO CP24: ORAL_CAPSULE | ORAL | Status: DC

## 2015-05-11 MED ORDER — ESCITALOPRAM OXALATE 10 MG PO TABS: 15.0000 mg | ORAL_TABLET | Freq: Every day | ORAL | Status: DC

## 2015-05-11 NOTE — Progress Notes (Signed)
Patient ID: Suzanne JaegerChristen Ehly, female   DOB: May 06, 1986, 29 y.o.   MRN: [id number]  [facility]  Suzanne Stephenson [id number] 29 y.o.  05/11/2015  Chief Complaint: depression and inattention.     History of Present Illness:   Patient returns for Medication Follow up and is diagnosed with depression. ADHD. GAd  Aggravating factors of depression; loss and grief her grandma died nearly 2014y after that her aunt died. She also has gone through a divorced 2013 . He had multiple sclerosis she took good care of him and was behaving as a caregiver despite that he left one day said that he will be with another woman. She has developed  closure about it and feels like she can move forward. Now has a boyfriend and his 2 sons. At times overhwelm with jobs and household chores and keeping her job   Depression: reasonable and not worse on lexapro ADHD: She is working 2  jobs at times feels inattentive. meds help, unable to focus without meds. GAD: not worse. lexapro helps. She does take some drug holidays on adderall.  No palpitations or side effects.  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  Duration of depression; on and off since age 29  Severity of depression; 7/10. 10 being very happy  Context: Losses in family. No closure to past divorce  Timing: variable  Severity of anxiety; 3/10. 10 being very anxious  Inattention stable on adderall.   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    Past Medical History  Diagnosis Date  . Anxiety   . Depression   . Headache(784.0)    Family History  Problem Relation Age of Onset  . Drug abuse Sister   . Alcohol abuse Maternal Aunt     Outpatient  Encounter Prescriptions as of 05/11/2015  Medication Sig  . amphetamine-dextroamphetamine (ADDERALL XR) 10 MG 24 hr capsule Fill on or after October 2016  . amphetamine-dextroamphetamine (ADDERALL XR) 10 MG 24 hr capsule Take one a day. Fill on or after December 14th, 2016  . escitalopram (LEXAPRO) 10 MG tablet Take 1.5 tablets (15 mg total) by mouth daily.  . [DISCONTINUED] amphetamine-dextroamphetamine (ADDERALL XR) 10 MG 24 hr capsule Take one a day.  . [DISCONTINUED] amphetamine-dextroamphetamine (ADDERALL XR) 10 MG 24 hr capsule Take one a day.  . [DISCONTINUED] escitalopram (LEXAPRO) 10 MG tablet Take 1.5 tablets (15 mg total) by mouth daily.   No facility-administered encounter medications on file as of 05/11/2015.    No results found for this or any previous visit (from the past 2160 hour(s)).  BP 112/68 mmHg  Pulse 86  Ht 5\' 5"  (1.651 m)  Wt 162 lb (73.483 kg)  BMI 26.96 kg/m2  SpO2 99%  BP 110/70 Pulse 88   Review of Systems  Cardiovascular: Negative for chest pain and palpitations.  Skin: Negative for itching.  Psychiatric/Behavioral: Negative for depression, suicidal ideas, hallucinations and substance abuse.    Mental Status Examination  Appearance: casual Alert: Yes Attention: fair  Cooperative: Yes Eye Contact: Fair Speech: normal Psychomotor Activity: Normal Memory/Concentration: adequate Oriented: person, place and time/date Mood: Euthymic Affect: Congruent Thought Processes and Associations: Coherent Fund of Knowledge: Fair Thought Content: Suicidal ideation and  Homicidal ideation were denied Insight: Fair Judgement: Fair  Diagnosis: 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 GAD.  ADHD  Treatment Plan:   Continue lexapro for depression, anxiety  adderall for ADhd.. Prescriptions written for 2 separate months. No heacahces or palpitations Denies substance abuse.  Advised to take drug holidays for adderall. Monitor her BP  frequently.  Pertinent Labs and Relevant Prior Notes reviewed. Medication Side effects, benefits and risks reviewed/discussed with Patient. Time given for patient to respond and asks questions regarding the Diagnosis and Medications. Safety concerns and to report to ER if suicidal or call 911. Relevant Medications refilled or called in to pharmacy.  Follow up with Primary care provider in regards to Medical conditions. Recommend compliance with medications and follow up office appointments. Discussed to avail opportunity to consider or/and continue Individual therapy with Counselor. Greater than 50% of time was spend in counseling and coordination of care with the patient, this includes custody concerns and dealing with her young step son.  Schedule for Follow up visit in 8  weeks or call in earlier as necessary.  Thresa Ross, MD 05/11/2015

## 2015-06-19 ENCOUNTER — Emergency Department
Admission: EM | Admit: 2015-06-19 | Discharge: 2015-06-19 | Disposition: A | Discharge status: Home / Self Care | Payer: Self-pay | Source: Home / Self Care | Attending: Family Medicine | Admitting: Family Medicine

## 2015-06-19 ENCOUNTER — Emergency Department (INDEPENDENT_AMBULATORY_CARE_PROVIDER_SITE_OTHER): Payer: 59

## 2015-06-19 ENCOUNTER — Encounter: Payer: Self-pay | Admitting: Emergency Medicine

## 2015-06-19 DIAGNOSIS — J209 Acute bronchitis, unspecified: Secondary | ICD-10-CM

## 2015-06-19 DIAGNOSIS — R05 Cough: Secondary | ICD-10-CM | POA: Diagnosis not present

## 2015-06-19 MED ORDER — BENZONATATE 200 MG PO CAPS: 200.0000 mg | ORAL_CAPSULE | Freq: Every day | ORAL | Status: DC

## 2015-06-19 MED ORDER — DOXYCYCLINE HYCLATE 100 MG PO CAPS: 100.0000 mg | ORAL_CAPSULE | Freq: Two times a day (BID) | ORAL | Status: DC

## 2015-06-19 NOTE — ED Provider Notes (Signed)
CSN: [id number]     Arrival date & time 06/19/15  1601 History   First MD Initiated Contact with Patient 06/19/15 1631     Chief Complaint  Patient presents with  . Cough      HPI Comments: One week ago patient developed typical cold-like symptoms including mild sore throat, sinus congestion, headache, and fatigue.  She developed a non-productive cough three days ago and now feels tightness in her anterior chest.  Today she has felt like her heart has been racing.  She states that she had pneumonia three years ago, and she feels like she did then. She continues to smoke.  The history is provided by the patient.    Past Medical History  Diagnosis Date  . Anxiety   . Depression   . Headache(784.0)    History reviewed. No pertinent past surgical history. Family History  Problem Relation Age of Onset  . Drug abuse Sister   . Alcohol abuse Maternal Aunt    Social History  Substance Use Topics  . Smoking status: Current Some Day Smoker -- 0.50 packs/day for 5 years    Types: Cigarettes  . Smokeless tobacco: Current User  . Alcohol Use: Yes   OB History    No data available     Review of Systems + sore throat + cough No pleuritic pain, but feels tight in anterior chest No wheezing + nasal congestion + post-nasal drainage No sinus pain/pressure No itchy/red eyes No earache No hemoptysis + SOB with activity No fever/chills No nausea No vomiting No abdominal pain No diarrhea No urinary symptoms No skin rash + fatigue + myalgias + headache Used OTC meds without relief  Allergies  Review of patient's allergies indicates no known allergies.  Home Medications   Prior to Admission medications   Medication Sig Start Date End Date Taking? Authorizing Provider  amphetamine-dextroamphetamine (ADDERALL XR) 10 MG 24 hr capsule Fill on or after October 2016 03/10/15   Thresa Ross, MD  amphetamine-dextroamphetamine (ADDERALL XR) 10 MG 24 hr capsule Take one a day. Fill  on or after December 14th, 2016 05/11/15   Thresa Ross, MD  benzonatate (TESSALON) 200 MG capsule Take 1 capsule (200 mg total) by mouth at bedtime. Take as needed for cough 06/19/15   Lattie Haw, MD  doxycycline (VIBRAMYCIN) 100 MG capsule Take 1 capsule (100 mg total) by mouth 2 (two) times daily. Take with food. 06/19/15   Lattie Haw, MD  escitalopram (LEXAPRO) 10 MG tablet Take 1.5 tablets (15 mg total) by mouth daily. 05/11/15 05/10/16  Thresa Ross, MD   Meds Ordered and Administered this Visit  Medications - No data to display  BP 120/79 mmHg  Pulse 126  Temp(Src) 98.5 F (36.9 C) (Oral)  Wt 160 lb (72.576 kg)  SpO2 99%  LMP 06/05/2015 No data found.   Physical Exam Nursing notes and Vital Signs reviewed. Appearance:  Patient appears stated age, and in no acute distress Eyes:  Pupils are equal, round, and reactive to light and accomodation.  Extraocular movement is intact.  Conjunctivae are not inflamed  Ears:  Canals normal.  Tympanic membranes normal.  Nose:  Mildly congested turbinates.  No sinus tenderness.   Pharynx:  Normal Neck:  Supple.  Tender enlarged posterior nodes are palpated bilaterally  Lungs:  Clear to auscultation.  Breath sounds are equal.  Moving air well. Chest:  Distinct tenderness to palpation over the mid-sternum.  Heart:  Regular rate and rhythm without murmurs, rubs,  or gallops. Rate 100 Abdomen:  Nontender without masses or hepatosplenomegaly.  Bowel sounds are present.  No CVA or flank tenderness.  Extremities:  No edema.  No calf tenderness Skin:  No rash present.   ED Course  Procedures  None   Imaging Review Dg Chest 2 View  06/19/2015  CLINICAL DATA:  Cough for 1 week.  Chest tightness. EXAM: CHEST  2 VIEW COMPARISON:  None. FINDINGS: Lungs are clear. Heart size and pulmonary vascularity are normal. No adenopathy. No pneumothorax. There is mild mid thoracic levoscoliosis. IMPRESSION: No edema or consolidation. Electronically  Signed   By: Bretta BangWilliam  Woodruff III M.D.   On: 06/19/2015 16:58      MDM   1. Acute bronchitis, unspecified organism    Begin doxycycline 100mg  BID for atypical coverage. Prescription written for Benzonatate [facility](Tessalon) to take at bedtime for night-time cough.   Take plain guaifenesin (1200mg  extended release tabs such as Mucinex) twice daily, with plenty of water, for cough and congestion. Get adequate rest.   May use Afrin nasal spray (or generic oxymetazoline) twice daily for about 5 days and then discontinue.  Also recommend using saline nasal spray several times daily and saline nasal irrigation (AYR is a common brand).  Use Flonase nasal spray each morning after using Afrin nasal spray and saline nasal irrigation. Try warm salt water gargles for sore throat.  Stop all antihistamines for now, and other non-prescription cough/cold preparations. May take Ibuprofen 200mg , 4 tabs every 8 hours with food for chest/sternum discomfort.   Follow-up with family doctor if not improving about 7 to10 days.     Lattie HawStephen A Beese, MD 06/19/15 [phone]

## 2015-06-19 NOTE — ED Notes (Signed)
Pt c/o cough with mucous and tightness in her chest x2 days. States she has felt her heart racing and "feels like she has pneumonia" . Denies fever.

## 2015-06-19 NOTE — Discharge Instructions (Signed)
Take plain guaifenesin (1200mg  extended release tabs such as Mucinex) twice daily, with plenty of water, for cough and congestion. Get adequate rest.   May use Afrin nasal spray (or generic oxymetazoline) twice daily for about 5 days and then discontinue.  Also recommend using saline nasal spray several times daily and saline nasal irrigation (AYR is a common brand).  Use Flonase nasal spray each morning after using Afrin nasal spray and saline nasal irrigation. Try warm salt water gargles for sore throat.  Stop all antihistamines for now, and other non-prescription cough/cold preparations. May take Ibuprofen 200mg , 4 tabs every 8 hours with food for chest/sternum discomfort.   Follow-up with family doctor if not improving about 7 to10 days.

## 2015-06-23 ENCOUNTER — Telehealth: Payer: Self-pay | Admitting: Emergency Medicine

## 2015-07-07 ENCOUNTER — Encounter (HOSPITAL_COMMUNITY): Payer: Self-pay | Admitting: Psychiatry

## 2015-07-07 ENCOUNTER — Ambulatory Visit (INDEPENDENT_AMBULATORY_CARE_PROVIDER_SITE_OTHER): Payer: BLUE CROSS/BLUE SHIELD | Admitting: Psychiatry

## 2015-07-07 VITALS — BP 128/75 | HR 80 | Wt 155.0 lb

## 2015-07-07 DIAGNOSIS — F900 Attention-deficit hyperactivity disorder, predominantly inattentive type: Secondary | ICD-10-CM | POA: Diagnosis not present

## 2015-07-07 DIAGNOSIS — F411 Generalized anxiety disorder: Secondary | ICD-10-CM | POA: Diagnosis not present

## 2015-07-07 DIAGNOSIS — F331 Major depressive disorder, recurrent, moderate: Secondary | ICD-10-CM

## 2015-07-07 MED ORDER — ESCITALOPRAM OXALATE 10 MG PO TABS: 15.0000 mg | ORAL_TABLET | Freq: Every day | ORAL | Status: DC

## 2015-07-07 MED ORDER — AMPHETAMINE-DEXTROAMPHET ER 10 MG PO CP24: ORAL_CAPSULE | ORAL | Status: DC

## 2015-07-07 NOTE — Progress Notes (Signed)
Patient ID: Suzanne JaegerChristen Senseney, female   DOB: Jun 20, 1986, 30 y.o.   MRN: [id number]  [facility]  Suzanne Stephenson [id number] 30 y.o.  07/07/2015  Chief Complaint: depression and inattention.     History of Present Illness:   Patient returns for Medication Follow up and is diagnosed with depression. ADHD. GAd  Aggravating factors of depression; loss and grief her grandma died  2014, after that her aunt died. She also has gone through a divorced 2013 . He had multiple sclerosis she took  care of him and was behaving as a caregiver despite that he left one day said that he will be with another woman. She has developed  closure about it and feels like she can move forward. Now has a boyfriend and his 2 sons. At times overhwelm with jobs and household chores and keeping her job   Depression: reasonable and not worse on lexapro. No side effects Some holiday blues during December but getting better. ADHD: She is working 2  jobs at times feels inattentive. meds help, unable to focus without meds. GAD: not worse. lexapro helps.  She does take some drug holidays on adderall.  No palpitations or side effects.  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  Duration of depression; on and off since age 30  Severity of depression; 7/10. 10 being very happy  Context: Losses in family. No closure to past divorce  Timing: variable  Severity of anxiety; 3/10. 10 being very anxious  Inattention stable on adderall.   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    Past Medical History  Diagnosis Date  . Anxiety   . Depression   . Headache(784.0)    Family History  Problem Relation Age of Onset  . Drug  abuse Sister   . Alcohol abuse Maternal Aunt     Outpatient Encounter Prescriptions as of 07/07/2015  Medication Sig  . amphetamine-dextroamphetamine (ADDERALL XR) 10 MG 24 hr capsule Fill on or after October 2016  . amphetamine-dextroamphetamine (ADDERALL XR) 10 MG 24 hr capsule Take one a day. Fill on or after December 14th, 2016  . amphetamine-dextroamphetamine (ADDERALL XR) 10 MG 24 hr capsule Take one a day. Fill on or after February 10th , 2017  . benzonatate (TESSALON) 200 MG capsule Take 1 capsule (200 mg total) by mouth at bedtime. Take as needed for cough  . doxycycline (VIBRAMYCIN) 100 MG capsule Take 1 capsule (100 mg total) by mouth 2 (two) times daily. Take with food.  . escitalopram (LEXAPRO) 10 MG tablet Take 1.5 tablets (15 mg total) by mouth daily.  . [DISCONTINUED] amphetamine-dextroamphetamine (ADDERALL XR) 10 MG 24 hr capsule Take one a day. Fill on or after December 14th, 2016  . [DISCONTINUED] escitalopram (LEXAPRO) 10 MG tablet Take 1.5 tablets (15 mg total) by mouth daily.   No facility-administered encounter medications on file as of 07/07/2015.    No results found for this or any previous visit (from the past 2160 hour(s)).  BP 128/75 mmHg  Pulse 80  Wt 155 lb (70.308 kg)  LMP 06/05/2015  BP 110/70 Pulse 88   Review of Systems  Cardiovascular: Negative for chest pain and palpitations.  Skin: Negative for itching  and rash.  Psychiatric/Behavioral: Negative for depression, suicidal ideas, hallucinations and substance abuse.    Mental Status Examination  Appearance: casual Alert: Yes Attention: fair  Cooperative: Yes Eye Contact: Fair Speech: normal Psychomotor Activity: Normal Memory/Concentration: adequate Oriented: person, place and time/date Mood: Euthymic Affect: Congruent Thought Processes and Associations: Coherent Fund of Knowledge: Fair Thought Content: Suicidal ideation and Homicidal ideation were denied Insight: Fair Judgement:  Fair  Diagnosis: 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 GAD.  ADHD  Treatment Plan:   Continue lexapro for depression, anxiety  adderall for ADhd.. Prescriptions written for 2 separate months. No heacahces or palpitations Denies substance abuse.  Advised to take drug holidays for adderall. Monitor her BP frequently. No weight gain or weight loss.  Pertinent Labs and Relevant Prior Notes reviewed. Medication Side effects, benefits and risks reviewed/discussed with Patient. Time given for patient to respond and asks questions regarding the Diagnosis and Medications. Safety concerns and to report to ER if suicidal or call 911. Relevant Medications refilled or called in to pharmacy.  Follow up with Primary care provider in regards to Medical conditions. Recommend compliance with medications and follow up office appointments. Discussed to avail opportunity to consider or/and continue Individual therapy with Counselor. Greater than 50% of time was spend in counseling and coordination of care with the patient, this includes custody concerns and dealing with her young step son.  Schedule for Follow up visit in 8  weeks or call in earlier as necessary.  Thresa Ross, MD 07/07/2015

## 2015-09-07 ENCOUNTER — Encounter (HOSPITAL_COMMUNITY): Payer: Self-pay | Admitting: Psychiatry

## 2015-09-07 ENCOUNTER — Ambulatory Visit (INDEPENDENT_AMBULATORY_CARE_PROVIDER_SITE_OTHER): Payer: BLUE CROSS/BLUE SHIELD | Admitting: Psychiatry

## 2015-09-07 VITALS — BP 110/64 | HR 89 | Ht 65.0 in | Wt 165.0 lb

## 2015-09-07 DIAGNOSIS — F900 Attention-deficit hyperactivity disorder, predominantly inattentive type: Secondary | ICD-10-CM

## 2015-09-07 DIAGNOSIS — F331 Major depressive disorder, recurrent, moderate: Secondary | ICD-10-CM | POA: Diagnosis not present

## 2015-09-07 DIAGNOSIS — F411 Generalized anxiety disorder: Secondary | ICD-10-CM | POA: Diagnosis not present

## 2015-09-07 MED ORDER — ESCITALOPRAM OXALATE 10 MG PO TABS: 15.0000 mg | ORAL_TABLET | Freq: Every day | ORAL | Status: DC

## 2015-09-07 MED ORDER — AMPHETAMINE-DEXTROAMPHET ER 15 MG PO CP24: ORAL_CAPSULE | ORAL | Status: DC

## 2015-09-07 NOTE — Progress Notes (Signed)
Patient ID: Suzanne Stephenson, female   DOB: February 02, 1986, 30 y.o.   MRN: [id number]  [facility] Follow-up Outpatient Visit  Suzanne Stephenson [id number] 29 y.o.  09/07/2015  Chief Complaint: depression and inattention.     History of Present Illness:   Patient returns for Medication Follow up and is diagnosed with depression. ADHD. GAd  Brief history and Aggravating factors of depression; loss and grief her grandma died  2012/10/29, after that her aunt died. She also has gone through a divorced 10-30-11 . He had multiple sclerosis she took  care of him and was behaving as a caregiver despite that he left one day said that he will be with another woman. She has developed  closure about it and feels like she can move forward. Now has a boyfriend and his 2 sons. At times overhwelm with jobs and household chores and keeping her job  She feels around 3:00 that ADHD medication wears off and she gets distracted some difficulty multitasking. Depression: reasonable and not worse on lexapro. No side effects  ADHD: She is working 2  jobs at times feels inattentive later part of day. meds help more in the morning, unable to focus without meds. GAD: not worse. lexapro helps.  She does take some drug holidays on adderall.  No palpitations or side effects. 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  Duration of depression; on and off since age 44  Severity of depression; 7/10. 10 being very happy  Context: Losses in family. No closure to past divorce  Timing: variable  Severity of anxiety; 3/10. 10 being very anxious      Past Medical History  Diagnosis Date  . Anxiety   . Depression   . Headache(784.0)    Family History  Problem Relation Age of  Onset  . Drug abuse Sister   . Alcohol abuse Maternal Aunt     Outpatient Encounter Prescriptions as of 09/07/2015  Medication Sig  . amphetamine-dextroamphetamine (ADDERALL XR) 15 MG 24 hr capsule Do not fill before. October 07, 2015. Take one a day PO  . benzonatate (TESSALON) 200 MG capsule Take 1 capsule (200 mg total) by mouth at bedtime. Take as needed for cough  . doxycycline (VIBRAMYCIN) 100 MG capsule Take 1 capsule (100 mg total) by mouth 2 (two) times daily. Take with food.  . escitalopram (LEXAPRO) 10 MG tablet Take 1.5 tablets (15 mg total) by mouth daily.  . [DISCONTINUED] amphetamine-dextroamphetamine (ADDERALL XR) 10 MG 24 hr capsule Fill on or after October 2016  . [DISCONTINUED] amphetamine-dextroamphetamine (ADDERALL XR) 10 MG 24 hr capsule Take one a day. Fill on or after December 14th, 2016  . [DISCONTINUED] amphetamine-dextroamphetamine (ADDERALL XR) 10 MG 24 hr capsule Take one a day. Fill on or after February 10th , 2017  . [DISCONTINUED] amphetamine-dextroamphetamine (ADDERALL XR) 15 MG 24 hr capsule Take one a day PO  . [DISCONTINUED] escitalopram (LEXAPRO) 10 MG tablet Take 1.5 tablets (15 mg total) by mouth daily.   No facility-administered encounter medications on file as of 09/07/2015.    No results found for this or any previous visit (from the past 10-30-58 hour(s)).  BP 110/64 mmHg  Pulse 89  Ht  (1.651 m)  Wt 165  lb (74.844 kg)  BMI 27.46 kg/m2  SpO2 99%  BP 110/70 Pulse 88   Review of Systems  Cardiovascular: Negative for chest pain.  Skin: Negative for itching and rash.  Neurological: Negative for tremors.  Psychiatric/Behavioral: Negative for depression, suicidal ideas, hallucinations and substance abuse.    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 GAD.  ADHD  Treatment Plan:   Continue lexapro for depression, anxiety  adderall for ADhd.. Increase adderall to 15mg  xr because of concern of inattention and cant focus later part of the day. Prescriptions written for 2 separate months. Advised to keep her pulse checked. Denies substance abuse.  Advised to take drug holidays for adderall. Monitor her BP frequently. No weight gain or weight loss.  Pertinent Labs and Relevant Prior Notes reviewed. Medication Side effects, benefits and risks reviewed/discussed with Patient. Time given for patient to respond and asks questions regarding the Diagnosis and Medications. Safety concerns and to report to ER if suicidal or call 911. Relevant Medications refilled or called in to pharmacy.  Follow up with Primary care provider in regards to Medical conditions. Recommend compliance with medications and follow up office appointments. Discussed to avail opportunity to consider or/and continue Individual therapy with Counselor. Greater than 50% of time was spend in counseling and coordination of care with the patient, this includes custody concerns and dealing with her young step son.  Schedule for Follow up visit in 8  weeks or call in earlier as necessary. Time spent: 25 minutes. Thresa RossAKHTAR, Kaeya Schiffer, MD 09/07/2015

## 2015-09-28 DIAGNOSIS — M25512 Pain in left shoulder: Secondary | ICD-10-CM | POA: Diagnosis not present

## 2015-09-28 DIAGNOSIS — M791 Myalgia: Secondary | ICD-10-CM | POA: Diagnosis not present

## 2015-09-28 DIAGNOSIS — M9901 Segmental and somatic dysfunction of cervical region: Secondary | ICD-10-CM | POA: Diagnosis not present

## 2015-09-28 DIAGNOSIS — M9903 Segmental and somatic dysfunction of lumbar region: Secondary | ICD-10-CM | POA: Diagnosis not present

## 2015-09-29 ENCOUNTER — Encounter: Payer: Self-pay | Admitting: Osteopathic Medicine

## 2015-09-29 ENCOUNTER — Ambulatory Visit (INDEPENDENT_AMBULATORY_CARE_PROVIDER_SITE_OTHER): Payer: BLUE CROSS/BLUE SHIELD | Admitting: Osteopathic Medicine

## 2015-09-29 VITALS — BP 128/80 | HR 80 | Ht 65.0 in | Wt 163.0 lb

## 2015-09-29 DIAGNOSIS — Z3009 Encounter for other general counseling and advice on contraception: Secondary | ICD-10-CM

## 2015-09-29 DIAGNOSIS — Z0000 Encounter for general adult medical examination without abnormal findings: Secondary | ICD-10-CM | POA: Diagnosis not present

## 2015-09-29 DIAGNOSIS — M6248 Contracture of muscle, other site: Secondary | ICD-10-CM

## 2015-09-29 DIAGNOSIS — M62838 Other muscle spasm: Secondary | ICD-10-CM

## 2015-09-29 LAB — POCT URINE PREGNANCY: Preg Test, Ur: NEGATIVE

## 2015-09-29 MED ORDER — ETONOGESTREL-ETHINYL ESTRADIOL 0.12-0.015 MG/24HR VA RING: VAGINAL_RING | VAGINAL | Status: DC

## 2015-09-29 MED ORDER — CYCLOBENZAPRINE HCL 5 MG PO TABS: 5.0000 mg | ORAL_TABLET | Freq: Three times a day (TID) | ORAL | Status: DC | PRN

## 2015-09-29 NOTE — Patient Instructions (Signed)
Can follow-up as needed for manipulation for neck pain.  Also given prescription for muscle relaxer to take as needed, would probably limit this to maybe 3-4 days out of the month, if you're finding that you're needing more frequent use of pain medications, this is something we should talk about in the office. Can use the muscle relaxers as needed, also okay to use ibuprofen up to 800 mg every 4-6 hours.   Let me know how you're doing on the NuvaRing! I sent in a year's supply that if he would like to change this we can do that. I also wrote to dispense 3 rings at a time if your insurance will allow it, but if this is more expensive for you or if you would like to just try one to start with I wrote instructions for the pharmacy should be ok to give you that as well.  If all is going well, let's plan to follow-up again in another year/as needed. Please let us know if there is anything else we can do for you! -Dr. Mervyn SkeetersA.

## 2015-09-29 NOTE — Progress Notes (Signed)
HPI: Suzanne Stephenson is a 30 y.o. female who [facility] [name] for chief complaint of:  Chief Complaint  Patient presents with  . Establish Care    PATIENT WANTS  ANNUAL PHYSICAL    See below for preventive care review  CONTRACEPTION - previously on birth control pills but didn't do well with this, also previously on IUD, she doesn't think this was inserted properly and she actually took it out herself  NECK PAIN - Works as Associate Professorcosmetologist, occasional neck pain, previously treated by chiropractic   Past medical, social and family history reviewed: Past Medical History  Diagnosis Date  . Anxiety   . Depression   . Headache(784.0)    History reviewed. No pertinent past surgical history. Social History  Substance Use Topics  . Smoking status: Current Some Day Smoker -- 0.50 packs/day for 5 years    Types: Cigarettes  . Smokeless tobacco: Current User  . Alcohol Use: Yes   Family History  Problem Relation Age of Onset  . Drug abuse Sister   . Alcohol abuse Maternal Aunt   . Alcohol abuse Paternal Aunt   . Cancer Paternal Aunt     Current Outpatient Prescriptions  Medication Sig Dispense Refill  . amphetamine-dextroamphetamine (ADDERALL XR) 15 MG 24 hr capsule Do not fill before. October 07, 2015. Take one a day PO 30 capsule 0  . escitalopram (LEXAPRO) 10 MG tablet Take 1.5 tablets (15 mg total) by mouth daily. 45 tablet 1   No current facility-administered medications for this visit.   No Known Allergies    Review of Systems: CONSTITUTIONAL:  No  fever, no chills, No  unintentional weight changes HEAD/EYES/EARS/NOSE/THROAT: No  headache, no vision change, no hearing change, No  sore throat, No  sinus pressure CARDIAC: No  chest pain, No  pressure, No palpitations, No  orthopnea RESPIRATORY: No  cough, No  shortness of breath/wheeze GASTROINTESTINAL: No  nausea, No  vomiting, No  abdominal pain, No  blood in stool, No   diarrhea, No  constipation  MUSCULOSKELETAL: No  myalgia/arthralgia GENITOURINARY: No  incontinence, No  abnormal genital bleeding/discharge SKIN: No  rash/wounds/concerning lesions HEM/ONC: No  easy bruising/bleeding, No  abnormal lymph node ENDOCRINE: No polyuria/polydipsia/polyphagia, No  heat/cold intolerance  NEUROLOGIC: No  weakness, No  dizziness, No  slurred speech PSYCHIATRIC: No  concerns with depression, No  concerns with anxiety, No sleep problems  Exam:  BP 128/80 mmHg  Pulse 80  Ht 5\' 5"  (1.651 m)  Wt 163 lb (73.936 kg)  BMI 27.12 kg/m2 Constitutional: VS see above. General Appearance: alert, well-developed, well-nourished, NAD Eyes: Normal lids and conjunctive, non-icteric sclera, PERRLA Ears, Nose, Mouth, Throat: MMM, Normal external inspection ears/nares/mouth/lips/gums, TM normal bilaterally. Pharynx no erythema, no exudate.  Neck: No masses, trachea midline. No thyroid enlargement/tenderness/mass appreciated. No lymphadenopathy Respiratory: Normal respiratory effort. no wheeze, no rhonchi, no rales Cardiovascular: S1/S2 normal, no murmur, no rub/gallop auscultated. RRR. No lower extremity edema. Gastrointestinal: Nontender, no masses. No hepatomegaly, no splenomegaly. No hernia appreciated. Bowel sounds normal. Rectal exam deferred.  Musculoskeletal: Gait normal. No clubbing/cyanosis of digits.  Neurological: No cranial nerve deficit on limited exam. Motor and sensation intact and symmetric Skin: warm, dry, intact. No rash/ulcer. No concerning nevi or subq nodules on limited exam.   Psychiatric: Normal judgment/insight. Normal mood and affect. Oriented x3.    No results found for this or any previous visit (from the past 72 hour(s)).   ASSESSMENT/PLAN: Otherwise healthy 30 year old  female, no significant family history would warrant early cancer screening or heart disease risk screening , declines STI testing at this time, states up-to-date on tetanus and Pap,  occasionally gets flu vaccine in the fall when she thinks about it. Occasional muscle spasm, plan to return for osteopathic manipulation treatments as needed when this is clearing up, cycle but the Prandin as needed. New prescription for NuvaRing, patient instructed on use, all questions answered, this birth control method is causing her any problems, let me know and we can talk about switching to something else, alternatively barrier method such as condoms, or abstinence  Annual physical exam  Encounter for other general counseling or advice on contraception - Plan: etonogestrel-ethinyl estradiol (NUVARING) 0.12-0.015 MG/24HR vaginal ring, DISCONTINUED: etonogestrel-ethinyl estradiol (NUVARING) 0.12-0.015 MG/24HR vaginal ring  Neck muscle spasm - Plan: cyclobenzaprine (FLEXERIL) 5 MG tablet    FEMALE PREVENTIVE CARE  ANNUAL SCREENING/COUNSELING Tobacco - yes, cigarette maybe once per day for about last 10 years  Alcohol - none Diet/Exercise - HEALTHY HABITS DISCUSSED TO DECREASE CV RISK - walking, occasional yoga  Depression - PQH2 Positive some issues, following iwth psychiatry  Domestic violence concerns - no HTN SCREENING - SEE VITALS Vaccination status - SEE BELOW  SEXUAL HEALTH Sexually active in the past year - yes With - Yes with female. STI - The patient denies history of sexually transmitted disease. STI testing today? - no   INFECTIOUS DISEASE SCREENING HIV - all adults 15-65 - does not need GC/CT - sexually active - does not need HepC - DOB [birth year] - does not need TB - does not need  DISEASE SCREENING Lipid - does not need DM2 - does not need Osteoporosis - does not need  CANCER SCREENING Cervical - need Breast - MAMMO - does not need Lung - does not need Colon - does not need  ADULT VACCINATION Influenza - already has  Td - already has,  HPV - already has Zoster - was not indicated Pneumonia - was not indicated  OTHER Fall - exercise and Vit D age 89+  - does not need Consider ASA - age 25-59 - does not need  Return in about 1 year (around 09/28/2016), or sooner if needed, for [employer]

## 2015-10-05 DIAGNOSIS — M9903 Segmental and somatic dysfunction of lumbar region: Secondary | ICD-10-CM | POA: Diagnosis not present

## 2015-10-05 DIAGNOSIS — M25512 Pain in left shoulder: Secondary | ICD-10-CM | POA: Diagnosis not present

## 2015-10-05 DIAGNOSIS — M9901 Segmental and somatic dysfunction of cervical region: Secondary | ICD-10-CM | POA: Diagnosis not present

## 2015-10-05 DIAGNOSIS — M791 Myalgia: Secondary | ICD-10-CM | POA: Diagnosis not present

## 2015-10-14 DIAGNOSIS — M9901 Segmental and somatic dysfunction of cervical region: Secondary | ICD-10-CM | POA: Diagnosis not present

## 2015-10-14 DIAGNOSIS — M9903 Segmental and somatic dysfunction of lumbar region: Secondary | ICD-10-CM | POA: Diagnosis not present

## 2015-10-14 DIAGNOSIS — M791 Myalgia: Secondary | ICD-10-CM | POA: Diagnosis not present

## 2015-10-14 DIAGNOSIS — M25512 Pain in left shoulder: Secondary | ICD-10-CM | POA: Diagnosis not present

## 2015-10-19 DIAGNOSIS — M9903 Segmental and somatic dysfunction of lumbar region: Secondary | ICD-10-CM | POA: Diagnosis not present

## 2015-10-19 DIAGNOSIS — M9901 Segmental and somatic dysfunction of cervical region: Secondary | ICD-10-CM | POA: Diagnosis not present

## 2015-10-19 DIAGNOSIS — M25512 Pain in left shoulder: Secondary | ICD-10-CM | POA: Diagnosis not present

## 2015-10-19 DIAGNOSIS — M791 Myalgia: Secondary | ICD-10-CM | POA: Diagnosis not present

## 2015-11-03 ENCOUNTER — Encounter (HOSPITAL_COMMUNITY): Payer: Self-pay | Admitting: Psychiatry

## 2015-11-03 ENCOUNTER — Ambulatory Visit (INDEPENDENT_AMBULATORY_CARE_PROVIDER_SITE_OTHER): Payer: BLUE CROSS/BLUE SHIELD | Admitting: Psychiatry

## 2015-11-03 VITALS — BP 104/74 | HR 80 | Ht 65.0 in | Wt 165.0 lb

## 2015-11-03 DIAGNOSIS — F329 Major depressive disorder, single episode, unspecified: Secondary | ICD-10-CM

## 2015-11-03 DIAGNOSIS — F419 Anxiety disorder, unspecified: Secondary | ICD-10-CM

## 2015-11-03 DIAGNOSIS — F411 Generalized anxiety disorder: Secondary | ICD-10-CM | POA: Diagnosis not present

## 2015-11-03 DIAGNOSIS — F900 Attention-deficit hyperactivity disorder, predominantly inattentive type: Secondary | ICD-10-CM | POA: Diagnosis not present

## 2015-11-03 DIAGNOSIS — F418 Other specified anxiety disorders: Secondary | ICD-10-CM

## 2015-11-03 DIAGNOSIS — F331 Major depressive disorder, recurrent, moderate: Secondary | ICD-10-CM | POA: Diagnosis not present

## 2015-11-03 DIAGNOSIS — F32A Depression, unspecified: Secondary | ICD-10-CM

## 2015-11-03 MED ORDER — AMPHETAMINE-DEXTROAMPHET ER 15 MG PO CP24: ORAL_CAPSULE | ORAL | Status: DC

## 2015-11-03 MED ORDER — ESCITALOPRAM OXALATE 10 MG PO TABS: 15.0000 mg | ORAL_TABLET | Freq: Every day | ORAL | Status: DC

## 2015-11-03 NOTE — Progress Notes (Signed)
Patient ID: Suzanne JaegerChristen Facemire, female   DOB: 02-Jul-1985, 30 y.o.   MRN: [id number]  [facility]  Suzanne Stephenson [id number] 29 y.o.  11/03/2015  Chief Complaint: depression and inattention.     History of Present Illness:   Patient returns for Medication Follow up and is diagnosed with depression. ADHD. GAd  Brief history and Aggravating factors of depression; loss and grief her grandma died  2014, she divorced 2013, he had  multiple sclerosis she took  care of him ,  he will be with another woman. She has developed  closure about it and feels like she can move forward. Now has a boyfriend and his 2 sons. At times overhwelm with jobs and household chores and keeping her job  Last visit she was feeling inattentive or having extraction by 3 PM and difficulty multitasking. We increased Adderall to 50 mg milligram and that is helpful  Depression: reasonable and not worse on lexapro. No side effects  ADHD: She is working 2  jobs at times feels inattentive later part of day.  unable to  Keep focus without meds. GAD: not worse. lexapro helps.  She does take some drug holidays on adderall.  No palpitations or side effects. 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  Duration of depression; on and off since age 30  Severity of depression; 7/10. 10 being very happy  Context: Losses in family.  past divorce  Timing: variable  Severity of anxiety; 3/10. 10 being very anxious      Past Medical History  Diagnosis Date  . Anxiety   . Depression   . Headache(784.0)    Family History  Problem Relation Age of Onset  . Drug abuse Sister   . Alcohol abuse Maternal Aunt   . Alcohol abuse Paternal Aunt   . Cancer Paternal Aunt     Outpatient Encounter Prescriptions as of 11/03/2015  Medication Sig  .  amphetamine-dextroamphetamine (ADDERALL XR) 15 MG 24 hr capsule Take one a day PO. Do not refill before December 03, 2015  . cyclobenzaprine (FLEXERIL) 5 MG tablet Take 1 tablet (5 mg total) by mouth 3 (three) times daily as needed for muscle spasms. USE SPARINGLY  . escitalopram (LEXAPRO) 10 MG tablet Take 1.5 tablets (15 mg total) by mouth daily.  Marland Kitchen. etonogestrel-ethinyl estradiol (NUVARING) 0.12-0.015 MG/24HR vaginal ring Insert vaginally and leave in place for 3 consecutive weeks, then remove for 1 week.  . [DISCONTINUED] amphetamine-dextroamphetamine (ADDERALL XR) 15 MG 24 hr capsule Do not fill before. October 07, 2015. Take one a day PO  . [DISCONTINUED] amphetamine-dextroamphetamine (ADDERALL XR) 15 MG 24 hr capsule Take one a day PO  . [DISCONTINUED] escitalopram (LEXAPRO) 10 MG tablet Take 1.5 tablets (15 mg total) by mouth daily.   No facility-administered encounter medications on file as of 11/03/2015.    Recent Results (from the past 2160 hour(s))  POCT urine pregnancy     Status: None   Collection Time: 09/29/15  4:25 PM  Result Value Ref Range   Preg Test, Ur Negative Negative    BP 104/74 mmHg  Pulse 80  Ht 5\' 5"  (1.651 m)  Wt 165 lb (74.844 kg)  BMI 27.46 kg/m2  BP 110/70 Pulse 88   Review of Systems  Cardiovascular: Negative for palpitations.  Skin: Negative for itching and rash.  Neurological: Negative  for tremors.  Psychiatric/Behavioral: Negative for depression, suicidal ideas, hallucinations and substance abuse.    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 GAD.  ADHD  Treatment Plan:   Continue lexapro for depression, anxiety  adderall for ADhd..continue adderall15mg  xr. Prescriptions written for 2 separate months. Advised to keep her pulse checked. No palpitations.  Denies substance abuse.  Advised to take drug holidays for adderall. Monitor her BP frequently.  Pertinent Labs and Relevant Prior Notes reviewed. Medication Side effects, benefits and risks reviewed/discussed with Patient. Time given for patient to respond and asks questions regarding the Diagnosis and Medications. Safety concerns and to report to ER if suicidal or call 911. Relevant Medications refilled or called in to pharmacy.  Follow up with Primary care provider in regards to Medical conditions.  Greater than 50% of time was spend in counseling and coordination of care with the patient, this includes custody concerns and dealing with her young step son.  Schedule for Follow up visit in 8  weeks or call in earlier as necessary. Time spent: 25 minutes. Thresa Ross, MD 11/03/2015

## 2015-11-10 DIAGNOSIS — J029 Acute pharyngitis, unspecified: Secondary | ICD-10-CM | POA: Diagnosis not present

## 2015-11-10 DIAGNOSIS — R05 Cough: Secondary | ICD-10-CM | POA: Diagnosis not present

## 2015-11-26 ENCOUNTER — Ambulatory Visit (INDEPENDENT_AMBULATORY_CARE_PROVIDER_SITE_OTHER): Payer: BLUE CROSS/BLUE SHIELD | Admitting: Osteopathic Medicine

## 2015-11-26 ENCOUNTER — Encounter: Payer: Self-pay | Admitting: Osteopathic Medicine

## 2015-11-26 VITALS — BP 134/95 | HR 102 | Wt 167.0 lb

## 2015-11-26 DIAGNOSIS — R51 Headache: Secondary | ICD-10-CM | POA: Diagnosis not present

## 2015-11-26 DIAGNOSIS — G44209 Tension-type headache, unspecified, not intractable: Secondary | ICD-10-CM

## 2015-11-26 DIAGNOSIS — R519 Headache, unspecified: Secondary | ICD-10-CM

## 2015-11-26 MED ORDER — BUTALBITAL-APAP-CAFFEINE 50-325-40 MG PO TABS: ORAL_TABLET | ORAL | Status: DC

## 2015-11-26 MED ORDER — BUTALBITAL-APAP-CAFFEINE 50-325-40 MG PO TABS: 1.0000 | ORAL_TABLET | Freq: Two times a day (BID) | ORAL | Status: DC | PRN

## 2015-11-26 MED ORDER — KETOROLAC TROMETHAMINE 60 MG/2ML IM SOLN
60.0000 mg | Freq: Once | INTRAMUSCULAR | Status: AC
Start: 2015-11-26 — End: 2015-11-26
  Administered 2015-11-26: 60 mg via INTRAMUSCULAR

## 2015-11-26 NOTE — Patient Instructions (Signed)
Tension Headache A tension headache is a feeling of pain, pressure, or aching that is often felt over the front and sides of the head. The pain can be dull, or it can feel tight (constricting). Tension headaches are not normally associated with nausea or vomiting, and they do not get worse with physical activity. Tension headaches can last from 30 minutes to several days. This is the most common type of headache. CAUSES The exact cause of this condition is not known. Tension headaches often begin after stress, anxiety, or depression. Other triggers may include:  Alcohol.  Too much caffeine, or caffeine withdrawal.  Respiratory infections, such as colds, flu, or sinus infections.  Dental problems or teeth clenching.  Fatigue.  Holding your head and neck in the same position for a long period of time, such as while using a computer.  Smoking. SYMPTOMS Symptoms of this condition include:  A feeling of pressure around the head.  Dull, aching head pain.  Pain felt over the front and sides of the head.  Tenderness in the muscles of the head, neck, and shoulders. DIAGNOSIS This condition may be diagnosed based on your symptoms and a physical exam. Tests may be done, such as a CT scan or an MRI of your head. These tests may be done if your symptoms are severe or unusual. TREATMENT This condition may be treated with lifestyle changes and medicines to help relieve symptoms. HOME CARE INSTRUCTIONS Managing Pain  Take over-the-counter and prescription medicines only as told by your health care provider.  Lie down in a dark, quiet room when you have a headache.  If directed, apply ice to the head and neck area:  Put ice in a plastic bag.  Place a towel between your skin and the bag.  Leave the ice on for 20 minutes, 2-3 times per day.  Use a heating pad or a hot shower to apply heat to the head and neck area as told by your health care provider. Eating and Drinking  Eat meals on  a regular schedule.  Limit alcohol use.  Decrease your caffeine intake, or stop using caffeine. General Instructions  Keep all follow-up visits as told by your health care provider. This is important.  Keep a headache journal to help find out what may trigger your headaches. For example, write down:  What you eat and drink.  How much sleep you get.  Any change to your diet or medicines.  Try massage or other relaxation techniques.  Limit stress.  Sit up straight, and avoid tensing your muscles.  Do not use tobacco products, including cigarettes, chewing tobacco, or e-cigarettes. If you need help quitting, ask your health care provider.  Exercise regularly as told by your health care provider.  Get 7-9 hours of sleep, or the amount recommended by your health care provider. SEEK MEDICAL CARE IF:  Your symptoms are not helped by medicine.  You have a headache that is different from what you normally experience.  You have nausea or you vomit.  You have a fever. SEEK IMMEDIATE MEDICAL CARE IF:  Your headache becomes severe.  You have repeated vomiting.  You have a stiff neck.  You have a loss of vision.  You have problems with speech.  You have pain in your eye or ear.  You have muscular weakness or loss of muscle control.  You lose your balance or you have trouble walking.  You feel faint or you pass out.  You have confusion.     This information is not intended to replace advice given to you by your health care provider. Make sure you discuss any questions you have with your health care provider.   Document Released: 06/13/2005 Document Revised: 03/04/2015 Document Reviewed: 10/06/2014 Elsevier Interactive Patient Education 2016 [employer]  

## 2015-11-26 NOTE — Progress Notes (Signed)
HPI: Suzanne Stephenson is a 30 y.o. female who presents to [name] today for chief complaint of:  Chief Complaint  Patient presents with  . Migraine    HEADACHE . Location: frontal/temporal, R . Quality: occasionally sharp/ mostly throbbing . Severity: mild/ moderate/ severe . Duration: has been going on for about a week  . Timing: lasts about half the day   . Modifying factors: has tried Ibuprofen and Excedrin Migraine with little relief . Assoc signs/symptoms: (+) photophobia, no phonophobia/ vision changes/ weakness/ speech changes/ association with menstrual cycle. Vision exam within the past year.    Past medical, social and family history reviewed: Past Medical History  Diagnosis Date  . Anxiety   . Depression   . Headache(784.0)    No past surgical history on file. Social History  Substance Use Topics  . Smoking status: Current Some Day Smoker -- 0.50 packs/day for 5 years    Types: Cigarettes  . Smokeless tobacco: Current User  . Alcohol Use: Yes   Family History  Problem Relation Age of Onset  . Drug abuse Sister   . Alcohol abuse Maternal Aunt   . Alcohol abuse Paternal Aunt   . Cancer Paternal Aunt     Current Outpatient Prescriptions  Medication Sig Dispense Refill  . amphetamine-dextroamphetamine (ADDERALL XR) 15 MG 24 hr capsule Take one a day PO. Do not refill before December 03, 2015 30 capsule 0  . cyclobenzaprine (FLEXERIL) 5 MG tablet Take 1 tablet (5 mg total) by mouth 3 (three) times daily as needed for muscle spasms. USE SPARINGLY 30 tablet 1  . escitalopram (LEXAPRO) 10 MG tablet Take 1.5 tablets (15 mg total) by mouth daily. 45 tablet 1  . etonogestrel-ethinyl estradiol (NUVARING) 0.12-0.015 MG/24HR vaginal ring Insert vaginally and leave in place for 3 consecutive weeks, then remove for 1 week. 3 each 4   No current facility-administered medications for this visit.   No Known Allergies    Review of  Systems: CONSTITUTIONAL:  No  fever, no chills HEAD/EYES/EARS/NOSE/THROAT: (+) headache, no vision change, no hearing change, No  sore throat, No  sinus pressure CARDIAC: No  chest pain, No  pressure RESPIRATORY: No  cough, No  shortness of breath/wheeze GASTROINTESTINAL: No  nausea, No  vomiting, No  abdominal pain, MUSCULOSKELETAL: No  myalgia/arthralgia NEUROLOGIC: No  weakness, No  dizziness, No  slurred speech   Exam:  BP 134/95 mmHg  Pulse 102  Wt 167 lb (75.751 kg) Constitutional: VS see above. General Appearance: alert, well-developed, well-nourished, NAD Eyes: Normal lids and conjunctive, non-icteric sclera, PERRLA, EOMI Ears, Nose, Mouth, Throat: MMM, Normal external inspection ears/nares/mouth/lips/gums, TM normal bilaterally. Pharynx no erythema, no exudate.  Neck: No masses, trachea midline. No thyroid enlargement/tenderness/mass appreciated. No lymphadenopathy, normal active ROM Respiratory: Normal respiratory effort. no wheeze, no rhonchi, no rales Cardiovascular: S1/S2 normal, no murmur, no rub/gallop auscultated. RRR.  Neurological: No cranial nerve deficit on limited exam. Motor and sensation intact and symmetric, normal gait Psychiatric: Normal judgment/insight. Normal mood and affect. Oriented x3.    ASSESSMENT/PLAN: Tension-type vs migraine variant headache, will hold off on phenergan due to patient concern for sedation, IM NSAID plus trial Esgic. Consider further workup if headache persists or worsens  Tension headache - Plan: butalbital-acetaminophen-caffeine (ESGIC) 50-325-40 MG tablet, ketorolac (TORADOL) injection 60 mg, DISCONTINUED: butalbital-acetaminophen-caffeine (ESGIC) 50-325-40 MG tablet   All questions were answered. Visit summary with updated medication list and pertinent instructions was printed for patient. ER/RTC precautions were  reviewed with the patient. Return if symptoms worsen or fail to improve.

## 2015-12-04 ENCOUNTER — Telehealth: Payer: Self-pay

## 2015-12-04 NOTE — Telephone Encounter (Signed)
Patient called stated that she is still having headaches. Patient thinks it is coming from Nuvaring because that is when she started noticing it. Please advise patient. Domnique Vantine,CMA

## 2015-12-05 NOTE — Telephone Encounter (Signed)
Possible that birth control can cause or exacerbate headache problems. Usually this is due to the estrogen component present in most forms of birth control pill as well as the NuvaRing. Can try taking on NuvaRing and the headaches are. If headaches persist, or if she would like to discuss alternative birth control options, would recommend that she follow-up in the office for further discussion

## 2015-12-14 NOTE — Telephone Encounter (Signed)
Left detailed message on patient vm with advise as noted below. Emilyrose Darrah,CMA  

## 2015-12-30 ENCOUNTER — Ambulatory Visit (HOSPITAL_COMMUNITY): Payer: Self-pay | Admitting: Psychiatry

## 2015-12-31 ENCOUNTER — Ambulatory Visit (INDEPENDENT_AMBULATORY_CARE_PROVIDER_SITE_OTHER): Payer: BLUE CROSS/BLUE SHIELD | Admitting: Psychiatry

## 2015-12-31 ENCOUNTER — Encounter (HOSPITAL_COMMUNITY): Payer: Self-pay | Admitting: Psychiatry

## 2015-12-31 VITALS — BP 110/74 | HR 74 | Ht 65.0 in | Wt 158.0 lb

## 2015-12-31 DIAGNOSIS — F411 Generalized anxiety disorder: Secondary | ICD-10-CM | POA: Diagnosis not present

## 2015-12-31 DIAGNOSIS — F331 Major depressive disorder, recurrent, moderate: Secondary | ICD-10-CM

## 2015-12-31 DIAGNOSIS — F900 Attention-deficit hyperactivity disorder, predominantly inattentive type: Secondary | ICD-10-CM

## 2015-12-31 MED ORDER — ESCITALOPRAM OXALATE 10 MG PO TABS: 10.0000 mg | ORAL_TABLET | Freq: Every day | ORAL | Status: DC

## 2015-12-31 MED ORDER — AMPHETAMINE-DEXTROAMPHET ER 15 MG PO CP24: ORAL_CAPSULE | ORAL | Status: DC

## 2015-12-31 NOTE — Progress Notes (Signed)
Patient ID: Suzanne Stephenson Towe, female   DOB: June 21, 1986, 30 y.o.   MRN: [id number]  [facility]  Suzanne Stephenson Pikus [id number] 29 y.o.  12/31/2015  Chief Complaint: depression and inattention.   History of Present Illness:   Patient returns for Medication Follow up and is diagnosed with depression. ADHD. GAd  Brief history and Aggravating factors of depression; loss and grief her grandma died  2014, she divorced 2013, he had  multiple sclerosis she took  care of him ,  he wanted to  be with another woman. She has developed  closure about itnow has moved forward.  She has BF and he has 2 kids.  ADHD  Increase dose of  Adderall to 50 mg milligram has been  helpful  Depression: reasonable and not worse on lexapro. No side effects  ADHD: She is working 2  jobs at times feels inattentive later part of day.  unable to  Keep focus without meds. GAD: not worse. lexapro helps.  She does take some drug holidays on adderall.  No palpitations or side effects. 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  Duration of depression; on and off since age 30  Severity of depression; 7/10. 10 being very happy  Context: Losses in family.  past divorce  Timing: variable  Severity of anxiety; 3/10. 10 being very anxious . Not worsened.      Past Medical History  Diagnosis Date  . Anxiety   . Depression   . Headache(784.0)    Family History  Problem Relation Age of Onset  . Drug abuse Sister   . Alcohol abuse Maternal Aunt   . Alcohol abuse Paternal Aunt   . Cancer Paternal Aunt     Outpatient Encounter Prescriptions as of 12/31/2015  Medication Sig  . amphetamine-dextroamphetamine (ADDERALL XR) 15 MG 24 hr capsule Take one a day PO.  Marland Kitchen. amphetamine-dextroamphetamine (ADDERALL XR) 15 MG 24 hr capsule Take one a day PO. Do not refill  before December 03, 2015  . butalbital-acetaminophen-caffeine (ESGIC) 50-325-40 MG tablet Take 1 to 2 tablet(s) as needed for severe headache. Can repeat dose once after 4 hours if needed. Use sparingly to avoid rebound headache. Do not take more than 4 tablets per day.  . cyclobenzaprine (FLEXERIL) 5 MG tablet Take 1 tablet (5 mg total) by mouth 3 (three) times daily as needed for muscle spasms. USE SPARINGLY  . escitalopram (LEXAPRO) 10 MG tablet Take 1 tablet (10 mg total) by mouth daily.  Marland Kitchen. etonogestrel-ethinyl estradiol (NUVARING) 0.12-0.015 MG/24HR vaginal ring Insert vaginally and leave in place for 3 consecutive weeks, then remove for 1 week.  . [DISCONTINUED] amphetamine-dextroamphetamine (ADDERALL XR) 15 MG 24 hr capsule Take one a day PO. Do not refill before December 03, 2015  . [DISCONTINUED] amphetamine-dextroamphetamine (ADDERALL XR) 15 MG 24 hr capsule Take one a day PO.  . [DISCONTINUED] escitalopram (LEXAPRO) 10 MG tablet Take 1.5 tablets (15 mg total) by mouth daily.   No facility-administered encounter medications on file as of 12/31/2015.    No results found for this or any previous visit (from the past 2160 hour(s)).  BP 110/74 mmHg  Pulse 74  Ht 5\' 5"  (1.651 m)  Wt 158 lb (71.668 kg)  BMI 26.29 kg/m2  BP 110/70 Pulse 88   Review of Systems  Cardiovascular: Negative for palpitations.  Skin:  Negative for itching and rash.  Neurological: Negative for tingling and tremors.  Psychiatric/Behavioral: Negative for depression, suicidal ideas, hallucinations and substance abuse.    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 GAD.  ADHD  Treatment Plan:   Continue lexapro for depression, anxiety  adderall for ADhd..continue adderall15mg  xr. Prescriptions written for 3  separate months. Vitals stable  Denies substance abuse.  Advised to take drug holidays for adderall. Monitor her BP frequently.  Pertinent Labs and Relevant Prior Notes reviewed. Medication Side effects, benefits and risks reviewed/discussed with Patient. Time given for patient to respond and asks questions regarding the Diagnosis and Medications. Safety concerns and to report to ER if suicidal or call 911. Relevant Medications refilled or called in to pharmacy.  Follow up with Primary care provider in regards to Medical conditions.  Greater than 50% of time was spend in counseling and coordination of care with the patient, this includes custody concerns and dealing with her young step son.  Schedule for Follow up visit in 3 months  or call in earlier as necessary. Time spent: 25 minutes. Thresa RossAKHTAR, Leilanni Halvorson, MD 12/31/2015

## 2016-01-20 DIAGNOSIS — R1030 Lower abdominal pain, unspecified: Secondary | ICD-10-CM | POA: Diagnosis not present

## 2016-01-20 DIAGNOSIS — N83202 Unspecified ovarian cyst, left side: Secondary | ICD-10-CM | POA: Diagnosis not present

## 2016-01-20 DIAGNOSIS — R1032 Left lower quadrant pain: Secondary | ICD-10-CM | POA: Diagnosis not present

## 2016-01-30 DIAGNOSIS — F419 Anxiety disorder, unspecified: Secondary | ICD-10-CM | POA: Diagnosis not present

## 2016-01-30 DIAGNOSIS — F909 Attention-deficit hyperactivity disorder, unspecified type: Secondary | ICD-10-CM | POA: Diagnosis not present

## 2016-01-30 DIAGNOSIS — N83292 Other ovarian cyst, left side: Secondary | ICD-10-CM | POA: Diagnosis not present

## 2016-01-30 DIAGNOSIS — R110 Nausea: Secondary | ICD-10-CM | POA: Diagnosis not present

## 2016-01-30 DIAGNOSIS — N939 Abnormal uterine and vaginal bleeding, unspecified: Secondary | ICD-10-CM | POA: Diagnosis not present

## 2016-01-30 DIAGNOSIS — F17210 Nicotine dependence, cigarettes, uncomplicated: Secondary | ICD-10-CM | POA: Diagnosis not present

## 2016-01-30 DIAGNOSIS — R1032 Left lower quadrant pain: Secondary | ICD-10-CM | POA: Diagnosis not present

## 2016-03-09 DIAGNOSIS — Z6827 Body mass index (BMI) 27.0-27.9, adult: Secondary | ICD-10-CM | POA: Diagnosis not present

## 2016-03-09 DIAGNOSIS — N83202 Unspecified ovarian cyst, left side: Secondary | ICD-10-CM | POA: Diagnosis not present

## 2016-03-09 DIAGNOSIS — Z01411 Encounter for gynecological examination (general) (routine) with abnormal findings: Secondary | ICD-10-CM | POA: Diagnosis not present

## 2016-03-22 ENCOUNTER — Ambulatory Visit (INDEPENDENT_AMBULATORY_CARE_PROVIDER_SITE_OTHER): Payer: BLUE CROSS/BLUE SHIELD | Admitting: Psychiatry

## 2016-03-22 ENCOUNTER — Encounter (HOSPITAL_COMMUNITY): Payer: Self-pay | Admitting: Psychiatry

## 2016-03-22 DIAGNOSIS — F331 Major depressive disorder, recurrent, moderate: Secondary | ICD-10-CM

## 2016-03-22 DIAGNOSIS — F411 Generalized anxiety disorder: Secondary | ICD-10-CM | POA: Diagnosis not present

## 2016-03-22 DIAGNOSIS — F900 Attention-deficit hyperactivity disorder, predominantly inattentive type: Secondary | ICD-10-CM

## 2016-03-22 MED ORDER — LORAZEPAM 0.5 MG PO TABS: 0.5000 mg | ORAL_TABLET | Freq: Every day | ORAL | 0 refills | Status: DC | PRN

## 2016-03-22 MED ORDER — AMPHETAMINE-DEXTROAMPHET ER 15 MG PO CP24: ORAL_CAPSULE | ORAL | 0 refills | Status: DC

## 2016-03-22 MED ORDER — ESCITALOPRAM OXALATE 10 MG PO TABS: 10.0000 mg | ORAL_TABLET | Freq: Every day | ORAL | 1 refills | Status: DC

## 2016-03-22 NOTE — Progress Notes (Signed)
Patient ID: Suzanne Stephenson, female   DOB: Oct 02, 1985, 30 y.o.   MRN: [id number]  [facility] Follow-up Outpatient Visit  Suzanne Stephenson [id number] 30 y.o.  03/22/2016  Chief Complaint: depression and inattention.   History of Present Illness:   Patient returns for Medication Follow up and is diagnosed with depression. ADHD. GAd  Brief history and Aggravating factors of depression; loss and grief her grandma died  11/28/12, she divorced Nov 29, 2011, he had  multiple sclerosis she took  care of him ,  he wanted to  be with another woman. She has developed  closure about itnow has moved forward.  She has BF and he has 2 kids.  Patient is going to fly to [facility] with her parents and she is anxious about flight says that she gets claustrophobic and having fear of flights she is requesting if she can get some medication for that and this is adding to her current anxiety  ADHD  IAdderall 15 mg  has been  helpful  Depression: reasonable and not worse on lexapro. No side effects  ADHD: She is working 2  jobs at times feels inattentive later part of day.  unable to  Keep focus without meds. GAD: somewhat increse because of upcoming flight.  She does take some drug holidays on adderall.  No palpitations or side effects. 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  Duration of depression; on and off since age 7  Severity of depression; 7/10. 10 being very happy  Context: Losses in family.  past divorce  Timing: variable  Severity of anxiety; 4/10. 10 being very anxious .      Past Medical History:  Diagnosis Date  . Anxiety   . Depression   . Headache(784.0)    Family History  Problem Relation Age of Onset  . Drug abuse Sister   . Alcohol abuse Maternal Aunt   . Alcohol abuse Paternal Aunt   . Cancer Paternal Aunt     Outpatient Encounter  Prescriptions as of 03/22/2016  Medication Sig  . amphetamine-dextroamphetamine (ADDERALL XR) 15 MG 24 hr capsule Take one a day PO.  Marland Kitchen amphetamine-dextroamphetamine (ADDERALL XR) 15 MG 24 hr capsule Take one a day PO.  . escitalopram (LEXAPRO) 10 MG tablet Take 1 tablet (10 mg total) by mouth daily.  Marland Kitchen etonogestrel-ethinyl estradiol (NUVARING) 0.12-0.015 MG/24HR vaginal ring Insert vaginally and leave in place for 3 consecutive weeks, then remove for 1 week.  Marland Kitchen LORazepam (ATIVAN) 0.5 MG tablet Take 1 tablet (0.5 mg total) by mouth daily as needed for anxiety.  . [DISCONTINUED] amphetamine-dextroamphetamine (ADDERALL XR) 15 MG 24 hr capsule Take one a day PO.  . [DISCONTINUED] amphetamine-dextroamphetamine (ADDERALL XR) 15 MG 24 hr capsule Take one a day PO. Do not refill before December 03, 2015  . [DISCONTINUED] butalbital-acetaminophen-caffeine (ESGIC) 50-325-40 MG tablet Take 1 to 2 tablet(s) as needed for severe headache. Can repeat dose once after 4 hours if needed. Use sparingly to avoid rebound headache. Do not take more than 4 tablets per day.  . [DISCONTINUED] cyclobenzaprine (FLEXERIL) 5 MG tablet Take 1 tablet (5 mg total) by mouth 3 (three) times daily as needed for muscle spasms. USE SPARINGLY  . [DISCONTINUED] escitalopram (LEXAPRO) 10 MG tablet Take 1 tablet (10 mg total) by mouth daily.   No facility-administered encounter medications on file as of 03/22/2016.  No results found for this or any previous visit (from the past 2160 hour(s)).  There were no vitals taken for this visit.  BP 110/70 Pulse 88   Review of Systems  Cardiovascular: Negative for palpitations.  Skin: Negative for itching and rash.  Neurological: Negative for tingling and tremors.  Psychiatric/Behavioral: Negative for depression, hallucinations, substance abuse and suicidal ideas. The patient is nervous/anxious.     Mental Status Examination  Appearance: casual Alert: Yes Attention: fair   Cooperative: Yes Eye Contact: Fair Speech: normal Psychomotor Activity: Normal Memory/Concentration: adequate Oriented: person, place and time/date Mood: Euthymic but somewhat anxious of upcoming flight. 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 GAD.  ADHD  Treatment Plan:   Continue lexapro for depression, anxiety Add ativan 0,5mg  prn for flight anxiety and apprehension. 10 tablets prescribed  adderall for ADhd..continue adderall15mg  xr. Prescriptions written for 3  separate months. Vitals stable  Denies substance abuse.  Advised to take drug holidays for adderall. Monitor her BP frequently.  Pertinent Labs and Relevant Prior Notes reviewed. Medication Side effects, benefits and risks reviewed/discussed with Patient. Time given for patient to respond and asks questions regarding the Diagnosis and Medications. Safety concerns and to report to ER if suicidal or call 911. Relevant Medications refilled or called in to pharmacy.  Follow up with Primary care provider in regards to Medical conditions.  Greater than 50% of time was spend in counseling and coordination of care with the patient, this includes custody concerns and dealing with her young step son.  Schedule for Follow up visit in 2-3 months  or call in earlier as necessary. Time spent: 25 minutes. Thresa RossAKHTAR, Deiondre Harrower, MD 03/22/2016

## 2016-04-01 ENCOUNTER — Encounter: Payer: Self-pay | Admitting: Osteopathic Medicine

## 2016-05-05 ENCOUNTER — Ambulatory Visit (INDEPENDENT_AMBULATORY_CARE_PROVIDER_SITE_OTHER): Payer: BLUE CROSS/BLUE SHIELD | Admitting: Sports Medicine

## 2016-05-05 ENCOUNTER — Ambulatory Visit: Payer: Self-pay | Admitting: Osteopathic Medicine

## 2016-05-05 ENCOUNTER — Encounter: Payer: Self-pay | Admitting: Sports Medicine

## 2016-05-05 ENCOUNTER — Ambulatory Visit (INDEPENDENT_AMBULATORY_CARE_PROVIDER_SITE_OTHER): Payer: BLUE CROSS/BLUE SHIELD

## 2016-05-05 DIAGNOSIS — R0989 Other specified symptoms and signs involving the circulatory and respiratory systems: Secondary | ICD-10-CM

## 2016-05-05 DIAGNOSIS — R05 Cough: Secondary | ICD-10-CM

## 2016-05-05 DIAGNOSIS — F17200 Nicotine dependence, unspecified, uncomplicated: Secondary | ICD-10-CM

## 2016-05-05 DIAGNOSIS — R059 Cough, unspecified: Secondary | ICD-10-CM | POA: Insufficient documentation

## 2016-05-05 MED ORDER — BENZONATATE 200 MG PO CAPS: 200.0000 mg | ORAL_CAPSULE | Freq: Three times a day (TID) | ORAL | 0 refills | Status: DC | PRN

## 2016-05-05 MED ORDER — HYDROCOD POLST-CPM POLST ER 10-8 MG/5ML PO SUER: 5.0000 mL | Freq: Two times a day (BID) | ORAL | 0 refills | Status: DC | PRN

## 2016-05-05 MED ORDER — FLUTICASONE PROPIONATE 50 MCG/ACT NA SUSP: NASAL | 3 refills | Status: AC

## 2016-05-05 NOTE — Assessment & Plan Note (Signed)
Postnasal drip with bronchitis. Chest x-ray, Flonase, Tessalon Perles, Tussionex. Return if no better in 2 weeks.

## 2016-05-05 NOTE — Progress Notes (Signed)
  Subjective:    CC: Coughing  HPI:  For the past several days this pleasant 30 year old female has had increasing cough, minimally productive without constitutional symptoms, no sinus pain or pressure, minimal sore throat. Symptoms are mild, persistent.  Past medical history:  Negative.  See flowsheet/record as well for more information.  Surgical history: Negative.  See flowsheet/record as well for more information.  Family history: Negative.  See flowsheet/record as well for more information.  Social history: Negative.  See flowsheet/record as well for more information.  Allergies, and medications have been entered into the medical record, reviewed, and no changes needed.   Review of Systems: No fevers, chills, night sweats, weight loss, chest pain, or shortness of breath.   Objective:    General: Well Developed, well nourished, and in no acute distress.  Neuro: Alert and oriented x3, extra-ocular muscles intact, sensation grossly intact.  HEENT: Normocephalic, atraumatic, pupils equal round reactive to light, neck supple, no masses, no lymphadenopathy, thyroid nonpalpable. Oropharynx, nasopharynx, ear canals unremarkable with the exception of slight erythema in the oropharynx. Skin: Warm and dry, no rashes. Cardiac: Regular rate and rhythm, no murmurs rubs or gallops, no lower extremity edema.  Respiratory: Clear to auscultation bilaterally. Not using accessory muscles, speaking in full sentences.  Impression and Recommendations:    Cough Postnasal drip with bronchitis. Chest x-ray, Flonase, Tessalon Perles, Tussionex. Return if no better in 2 weeks.  I spent 25 minutes with this patient, greater than 50% was face-to-face time counseling regarding the above diagnoses

## 2016-05-16 ENCOUNTER — Ambulatory Visit (INDEPENDENT_AMBULATORY_CARE_PROVIDER_SITE_OTHER): Payer: BLUE CROSS/BLUE SHIELD | Admitting: Psychiatry

## 2016-05-16 ENCOUNTER — Encounter (HOSPITAL_COMMUNITY): Payer: Self-pay | Admitting: Psychiatry

## 2016-05-16 VITALS — BP 110/84 | HR 94

## 2016-05-16 DIAGNOSIS — Z813 Family history of other psychoactive substance abuse and dependence: Secondary | ICD-10-CM

## 2016-05-16 DIAGNOSIS — Z811 Family history of alcohol abuse and dependence: Secondary | ICD-10-CM

## 2016-05-16 DIAGNOSIS — Z79899 Other long term (current) drug therapy: Secondary | ICD-10-CM

## 2016-05-16 DIAGNOSIS — F900 Attention-deficit hyperactivity disorder, predominantly inattentive type: Secondary | ICD-10-CM

## 2016-05-16 DIAGNOSIS — F331 Major depressive disorder, recurrent, moderate: Secondary | ICD-10-CM | POA: Diagnosis not present

## 2016-05-16 DIAGNOSIS — F411 Generalized anxiety disorder: Secondary | ICD-10-CM | POA: Diagnosis not present

## 2016-05-16 DIAGNOSIS — Z808 Family history of malignant neoplasm of other organs or systems: Secondary | ICD-10-CM

## 2016-05-16 MED ORDER — AMPHETAMINE-DEXTROAMPHET ER 15 MG PO CP24: ORAL_CAPSULE | ORAL | 0 refills | Status: DC

## 2016-05-16 MED ORDER — ESCITALOPRAM OXALATE 10 MG PO TABS: 10.0000 mg | ORAL_TABLET | Freq: Every day | ORAL | 1 refills | Status: DC

## 2016-05-16 MED ORDER — BUPROPION HCL ER (SR) 100 MG PO TB12: 100.0000 mg | ORAL_TABLET | Freq: Two times a day (BID) | ORAL | 0 refills | Status: DC

## 2016-05-16 NOTE — Progress Notes (Signed)
Patient ID: Suzanne Stephenson, female   DOB: [birth year], 30 y.o.   MRN: [id number]  [facility]  Suzanne Stephenson [id number] 30 y.o.  05/16/2016  Chief Complaint: depression and inattention.   History of Present Illness:   Patient returns for Medication Follow up and is diagnosed with depression. ADHD. GAd  Patient is expressing depression low energy more sleep she is wondering if it is winter depression. She continues take her ADHD medication and helps her concentration she does take drug holidays no reported side effects some palpitations at times but overall she is also drinking more coffee a times  She agrees Lexapro is helping her anxiety was not a depression her job hours or longer. Some stress at home but nothing out of the ordinary [place] [facility] [place] Ativan did help her flight anxiety  Modifying factors; her relationship with her fianc Aggravating factors; past divorced .      Past Medical History:  Diagnosis Date  . Anxiety   . Depression   . Headache(784.0)    Family History  Problem Relation Age of Onset  . Drug abuse Sister   . Alcohol abuse Maternal Aunt   . Alcohol abuse Paternal Aunt   . Cancer Paternal Aunt     Outpatient Encounter Prescriptions as of 05/16/2016  Medication Sig  . amphetamine-dextroamphetamine (ADDERALL XR) 15 MG 24 hr capsule Take one a day PO.  Marland Kitchen. amphetamine-dextroamphetamine (ADDERALL XR) 15 MG 24 hr capsule Take one a day PO.  . benzonatate (TESSALON) 200 MG capsule Take 1 capsule (200 mg total) by mouth 3 (three) times daily as needed for cough.  Marland Kitchen. buPROPion (WELLBUTRIN SR) 100 MG 12 hr tablet Take 1 tablet (100 mg total) by mouth 2 (two) times daily.  . chlorpheniramine-HYDROcodone (TUSSIONEX) 10-8 MG/5ML SUER Take 5 mLs by mouth every 12 (twelve) hours as needed for cough (cough, will cause drowsiness.).  Marland Kitchen. escitalopram (LEXAPRO) 10 MG tablet Take 1 tablet (10 mg total)  by mouth daily.  Marland Kitchen. etonogestrel-ethinyl estradiol (NUVARING) 0.12-0.015 MG/24HR vaginal ring Insert vaginally and leave in place for 3 consecutive weeks, then remove for 1 week.  . fluticasone (FLONASE) 50 MCG/ACT nasal spray One spray in each nostril twice a day, use left hand for right nostril, and right hand for left nostril.  Marland Kitchen. LORazepam (ATIVAN) 0.5 MG tablet Take 1 tablet (0.5 mg total) by mouth daily as needed for anxiety.  . [DISCONTINUED] amphetamine-dextroamphetamine (ADDERALL XR) 15 MG 24 hr capsule Take one a day PO.  . [DISCONTINUED] amphetamine-dextroamphetamine (ADDERALL XR) 15 MG 24 hr capsule Take one a day PO.  . [DISCONTINUED] escitalopram (LEXAPRO) 10 MG tablet Take 1 tablet (10 mg total) by mouth daily.   No facility-administered encounter medications on file as of 05/16/2016.     No results found for this or any previous visit (from the past 2160 hour(s)).  BP 110/84   Pulse 94   BP 110/70 Pulse 88   Review of Systems  Cardiovascular: Negative for chest pain.  Gastrointestinal: Negative for nausea.  Skin: Negative for rash.  Neurological: Negative for tingling and tremors.  Psychiatric/Behavioral: Positive for depression. Negative for hallucinations and substance abuse. The patient is nervous/anxious.     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 GAD.  ADHD  Treatment Plan:   Depression; worsened will add Wellbutrin 100 mg increase to twice a day in the next 1 week discussed and  reviewed side effects continue Lexapro 10 mg ADHD; continue Adderall once a day 2 prescriptions given Anxiety; Lexapro as above Possible winter depression or seasonal affective; wellbutrin started./   Pertinent Labs and Relevant Prior Notes reviewed. Medication Side effects, benefits and risks reviewed/discussed with Patient. Time given for patient to respond and asks questions regarding the Diagnosis and Medications. Safety concerns and to report to ER if suicidal or call 911. Relevant Medications refilled or called in to pharmacy.  Follow up with Primary care provider in regards to Medical conditions. Prescription send follow-up in 4 weeks or earlier if needed Thresa RossAKHTAR, Aldean Suddeth, MD 05/16/2016

## 2016-05-26 DIAGNOSIS — Z114 Encounter for screening for human immunodeficiency virus [HIV]: Secondary | ICD-10-CM | POA: Diagnosis not present

## 2016-05-26 DIAGNOSIS — Z113 Encounter for screening for infections with a predominantly sexual mode of transmission: Secondary | ICD-10-CM | POA: Diagnosis not present

## 2016-06-14 ENCOUNTER — Ambulatory Visit (INDEPENDENT_AMBULATORY_CARE_PROVIDER_SITE_OTHER): Payer: BLUE CROSS/BLUE SHIELD | Admitting: Psychiatry

## 2016-06-14 ENCOUNTER — Encounter (HOSPITAL_COMMUNITY): Payer: Self-pay | Admitting: Psychiatry

## 2016-06-14 VITALS — BP 124/70 | HR 72 | Resp 16 | Ht 65.0 in | Wt 169.0 lb

## 2016-06-14 DIAGNOSIS — F900 Attention-deficit hyperactivity disorder, predominantly inattentive type: Secondary | ICD-10-CM

## 2016-06-14 DIAGNOSIS — Z79899 Other long term (current) drug therapy: Secondary | ICD-10-CM

## 2016-06-14 DIAGNOSIS — Z811 Family history of alcohol abuse and dependence: Secondary | ICD-10-CM | POA: Diagnosis not present

## 2016-06-14 DIAGNOSIS — Z808 Family history of malignant neoplasm of other organs or systems: Secondary | ICD-10-CM

## 2016-06-14 DIAGNOSIS — F331 Major depressive disorder, recurrent, moderate: Secondary | ICD-10-CM | POA: Diagnosis not present

## 2016-06-14 DIAGNOSIS — Z813 Family history of other psychoactive substance abuse and dependence: Secondary | ICD-10-CM

## 2016-06-14 DIAGNOSIS — F411 Generalized anxiety disorder: Secondary | ICD-10-CM | POA: Diagnosis not present

## 2016-06-14 MED ORDER — AMPHETAMINE-DEXTROAMPHET ER 15 MG PO CP24: ORAL_CAPSULE | ORAL | 0 refills | Status: DC

## 2016-06-14 MED ORDER — BUPROPION HCL ER (SR) 200 MG PO TB12: 200.0000 mg | ORAL_TABLET | Freq: Two times a day (BID) | ORAL | 1 refills | Status: DC

## 2016-06-14 NOTE — Progress Notes (Signed)
Patient ID: Suzanne Stephenson, female   DOB: 12-23-1985, 30 y.o.   MRN: [id number]  [facility]  Suzanne Stephenson [id number] 30 y.o.  06/14/2016  Chief Complaint: depression and inattention.   History of Present Illness:   Patient returns for Medication Follow up and is diagnosed with depression. ADHD. GAd  Last visit we added Wellbutrin now at a dose of 200 mg for depression she has improved less depressed she is able to deal with. Continues to work and Adderall helps her inattention. Modifying factors relationship with her fianc aggravating factors as past divorced  .      Past Medical History:  Diagnosis Date  . Anxiety   . Depression   . Headache(784.0)    Family History  Problem Relation Age of Onset  . Drug abuse Sister   . Alcohol abuse Maternal Aunt   . Alcohol abuse Paternal Aunt   . Cancer Paternal Aunt     Outpatient Encounter Prescriptions as of 06/14/2016  Medication Sig  . amphetamine-dextroamphetamine (ADDERALL XR) 15 MG 24 hr capsule Take one a day PO.  Marland Kitchen. amphetamine-dextroamphetamine (ADDERALL XR) 15 MG 24 hr capsule Take one a day PO.  . buPROPion (WELLBUTRIN SR) 200 MG 12 hr tablet Take 1 tablet (200 mg total) by mouth 2 (two) times daily.  . chlorpheniramine-HYDROcodone (TUSSIONEX) 10-8 MG/5ML SUER Take 5 mLs by mouth every 12 (twelve) hours as needed for cough (cough, will cause drowsiness.).  Marland Kitchen. escitalopram (LEXAPRO) 10 MG tablet Take 1 tablet (10 mg total) by mouth daily.  . fluticasone (FLONASE) 50 MCG/ACT nasal spray One spray in each nostril twice a day, use left hand for right nostril, and right hand for left nostril.  . [DISCONTINUED] amphetamine-dextroamphetamine (ADDERALL XR) 15 MG 24 hr capsule Take one a day PO.  . [DISCONTINUED] buPROPion (WELLBUTRIN SR) 100 MG 12 hr tablet Take 1 tablet (100 mg total) by mouth 2 (two) times daily.  . benzonatate (TESSALON) 200 MG capsule Take 1 capsule (200 mg  total) by mouth 3 (three) times daily as needed for cough. (Patient not taking: Reported on 06/14/2016)  . etonogestrel-ethinyl estradiol (NUVARING) 0.12-0.015 MG/24HR vaginal ring Insert vaginally and leave in place for 3 consecutive weeks, then remove for 1 week. (Patient not taking: Reported on 06/14/2016)  . [DISCONTINUED] LORazepam (ATIVAN) 0.5 MG tablet Take 1 tablet (0.5 mg total) by mouth daily as needed for anxiety. (Patient not taking: Reported on 06/14/2016)   No facility-administered encounter medications on file as of 06/14/2016.     No results found for this or any previous visit (from the past 2160 hour(s)).  BP 124/70 (BP Location: Right Arm, Patient Position: Sitting, Cuff Size: Normal)   Pulse 72   Resp 16   Ht 5\' 5"  (1.651 m)   Wt 169 lb (76.7 kg)   LMP 06/09/2016   SpO2 96%   BMI 28.12 kg/m   BP 110/70 Pulse 88   Review of Systems  Cardiovascular: Negative for palpitations.  Gastrointestinal: Negative for nausea.  Skin: Negative for rash.  Neurological: Negative for tingling and tremors.  Psychiatric/Behavioral: Negative for hallucinations and substance abuse.    Mental Status Examination  Appearance: casual Alert: Yes Attention: fair  Cooperative: Yes Eye Contact: Fair Speech: normal Psychomotor Activity: Normal Memory/Concentration: adequate Oriented: person, place and time/date Mood:euthymic Affect: reactive Thought Processes and Associations: Coherent Fund of Knowledge: Fair Thought Content: Suicidal ideation and Homicidal ideation were denied Insight: Fair Judgement: Fair  Diagnosis: Maj. depressive disorder  recurrent moderate. Anxiety disorder NOS. GAD.  ADHD  Treatment Plan:   Depression: improved. Continue wellbutrin change to 200mg  sr tablet. Prescription sent ADHDL continue current plan Anxiety: lexapro as above. Has refills  FU 2 months.  Thresa RossAKHTAR, Esbeydi Manago, MD 06/14/2016

## 2016-06-16 ENCOUNTER — Telehealth (HOSPITAL_COMMUNITY): Payer: Self-pay | Admitting: *Deleted

## 2016-06-16 MED ORDER — BUPROPION HCL ER (SR) 200 MG PO TB12: 200.0000 mg | ORAL_TABLET | Freq: Two times a day (BID) | ORAL | 1 refills | Status: DC

## 2016-06-16 NOTE — Telephone Encounter (Signed)
Med management- pt called office stating the quantity for the Wellbutrin Rx is incorrect. Pt states she is taking medication twice daily. rx was sent to pharmacy for a 15 day supply.   Per Dr. Gilmore LarocheAkhtar, please send a new prescription for Wellbutrin 200mg , #60.Called and informed pt. Pt verbalizes understanding.

## 2016-08-09 ENCOUNTER — Ambulatory Visit (INDEPENDENT_AMBULATORY_CARE_PROVIDER_SITE_OTHER): Payer: BLUE CROSS/BLUE SHIELD | Admitting: Family Medicine

## 2016-08-09 ENCOUNTER — Encounter: Payer: Self-pay | Admitting: Family Medicine

## 2016-08-09 DIAGNOSIS — R059 Cough, unspecified: Secondary | ICD-10-CM

## 2016-08-09 DIAGNOSIS — R05 Cough: Secondary | ICD-10-CM | POA: Diagnosis not present

## 2016-08-09 MED ORDER — HYDROCOD POLST-CPM POLST ER 10-8 MG/5ML PO SUER: 5.0000 mL | Freq: Two times a day (BID) | ORAL | 0 refills | Status: DC | PRN

## 2016-08-09 MED ORDER — IPRATROPIUM BROMIDE 0.06 % NA SOLN: 2.0000 | NASAL | 6 refills | Status: DC | PRN

## 2016-08-09 MED ORDER — CEFDINIR 300 MG PO CAPS: 300.0000 mg | ORAL_CAPSULE | Freq: Two times a day (BID) | ORAL | 0 refills | Status: DC

## 2016-08-09 MED ORDER — BENZONATATE 200 MG PO CAPS: 200.0000 mg | ORAL_CAPSULE | Freq: Three times a day (TID) | ORAL | 0 refills | Status: DC | PRN

## 2016-08-09 NOTE — Progress Notes (Signed)
Suzanne Stephenson is a 31 y.o. female who [facility] to [facility]: Primary Care Sports Medicine today for cough congestion and headaches sore throat and body aches and nausea. Symptoms present for about a day. Patient denies significant wheezing or severe shortness of breath. She denies any vomiting or diarrhea. She's tried some over-the-counter medications which help a bit. She works as a Interior and spatial designerhairdresser but denies any significantly known sick contacts. She feels well otherwise.   Past Medical History:  Diagnosis Date  . Anxiety   . Depression   . Headache(784.0)    No past surgical history on file. Social History  Substance Use Topics  . Smoking status: Former Smoker    Packs/day: 0.50    Years: 5.00    Types: Cigarettes    Quit date: 05/27/2016  . Smokeless tobacco: Former NeurosurgeonUser  . Alcohol use No   family history includes Alcohol abuse in her maternal aunt and paternal aunt; Cancer in her paternal aunt; Drug abuse in her sister.  ROS as above:  Medications: Current Outpatient Prescriptions  Medication Sig Dispense Refill  . amphetamine-dextroamphetamine (ADDERALL XR) 15 MG 24 hr capsule Take one a day PO. 30 capsule 0  . amphetamine-dextroamphetamine (ADDERALL XR) 15 MG 24 hr capsule Take one a day PO. 30 capsule 0  . benzonatate (TESSALON) 200 MG capsule Take 1 capsule (200 mg total) by mouth 3 (three) times daily as needed for cough. 45 capsule 0  . buPROPion (WELLBUTRIN SR) 200 MG 12 hr tablet Take 1 tablet (200 mg total) by mouth 2 (two) times daily. Please discontinue all previous prescriptions. 60 tablet 1  . cefdinir (OMNICEF) 300 MG capsule Take 1 capsule (300 mg total) by mouth 2 (two) times daily. 14 capsule 0  . chlorpheniramine-HYDROcodone (TUSSIONEX) 10-8 MG/5ML SUER Take 5 mLs by mouth every 12 (twelve) hours as needed for cough (cough, will cause drowsiness.). 120 mL 0  .  escitalopram (LEXAPRO) 10 MG tablet Take 1 tablet (10 mg total) by mouth daily. 30 tablet 1  . etonogestrel-ethinyl estradiol (NUVARING) 0.12-0.015 MG/24HR vaginal ring Insert vaginally and leave in place for 3 consecutive weeks, then remove for 1 week. (Patient not taking: Reported on 06/14/2016) 3 each 4  . fluticasone (FLONASE) 50 MCG/ACT nasal spray One spray in each nostril twice a day, use left hand for right nostril, and right hand for left nostril. 48 g 3  . ipratropium (ATROVENT) 0.06 % nasal spray Place 2 sprays into both nostrils every 4 (four) hours as needed for rhinitis. 10 mL 6   No current facility-administered medications for this visit.    No Known Allergies  Health Maintenance Health Maintenance  Topic Date Due  . HIV Screening  03/10/2001  . INFLUENZA VACCINE  01/26/2016  . PAP SMEAR  09/25/2017  . TETANUS/TDAP  06/27/2024     Exam:  BP 121/74   Pulse 84   Temp 98 F (36.7 C)   Wt 169 lb (76.7 kg)   SpO2 100%   BMI 28.12 kg/m  Gen: Well NAD Nontoxic appearing HEENT: EOMI,  MMM Clear nasal discharge. Posterior pharynx with cobblestoning. Normal tympanic membranes bilaterally. Lungs: Normal work of breathing. CTABL Heart: RRR no MRG Abd: NABS, Soft. Nondistended, Nontender Exts: Brisk capillary refill, warm and well perfused.   Point of care rapid flu test is negative  No results found for this or any previous visit (from the past 72 hour(s)). No results found.    Assessment  and Plan: 31 y.o. female with viral URI. Symptomatic management with Atrovent nasal spray hydrocodone cough syrup and Tessalon Perles. Use Omnicef for backup antibiotics if patient worsens.   No orders of the defined types were placed in this encounter.  Meds ordered this encounter  Medications  . benzonatate (TESSALON) 200 MG capsule    Sig: Take 1 capsule (200 mg total) by mouth 3 (three) times daily as needed for cough.    Dispense:  45 capsule    Refill:  0  .  chlorpheniramine-HYDROcodone (TUSSIONEX) 10-8 MG/5ML SUER    Sig: Take 5 mLs by mouth every 12 (twelve) hours as needed for cough (cough, will cause drowsiness.).    Dispense:  120 mL    Refill:  0  . ipratropium (ATROVENT) 0.06 % nasal spray    Sig: Place 2 sprays into both nostrils every 4 (four) hours as needed for rhinitis.    Dispense:  10 mL    Refill:  6  . cefdinir (OMNICEF) 300 MG capsule    Sig: Take 1 capsule (300 mg total) by mouth 2 (two) times daily.    Dispense:  14 capsule    Refill:  0     Discussed warning signs or symptoms. Please see discharge instructions. Patient expresses understanding.  3

## 2016-08-09 NOTE — Patient Instructions (Signed)
Thank you for coming in today. I think you have a bad cold virus.  Continue over the counter medicines.  Use the nasal spray and the cough pills during the day as needed.  Use the hydrocodone cough medicine at night as needed.  If worse take the omnicef antibiotic.  Call or go to the emergency room if you get worse, have trouble breathing, have chest pains, or palpitations.    Upper Respiratory Infection, Adult Most upper respiratory infections (URIs) are a viral infection of the air passages leading to the lungs. A URI affects the nose, throat, and upper air passages. The most common type of URI is nasopharyngitis and is typically referred to as "the common cold." URIs run their course and usually go away on their own. Most of the time, a URI does not require medical attention, but sometimes a bacterial infection in the upper airways can follow a viral infection. This is called a secondary infection. Sinus and middle ear infections are common types of secondary upper respiratory infections. Bacterial pneumonia can also complicate a URI. A URI can worsen asthma and chronic obstructive pulmonary disease (COPD). Sometimes, these complications can require emergency medical care and may be life threatening. What are the causes? Almost all URIs are caused by viruses. A virus is a type of germ and can spread from one person to another. What increases the risk? You may be at risk for a URI if:  You smoke.  You have chronic heart or lung disease.  You have a weakened defense (immune) system.  You are very young or very old.  You have nasal allergies or asthma.  You work in crowded or poorly ventilated areas.  You work in health care facilities or schools. What are the signs or symptoms? Symptoms typically develop 2-3 days after you come in contact with a cold virus. Most viral URIs last 7-10 days. However, viral URIs from the influenza virus (flu virus) can last 14-18 days and are typically  more severe. Symptoms may include:  Runny or stuffy (congested) nose.  Sneezing.  Cough.  Sore throat.  Headache.  Fatigue.  Fever.  Loss of appetite.  Pain in your forehead, behind your eyes, and over your cheekbones (sinus pain).  Muscle aches. How is this diagnosed? Your health care provider may diagnose a URI by:  Physical exam.  Tests to check that your symptoms are not due to another condition such as:  Strep throat.  Sinusitis.  Pneumonia.  Asthma. How is this treated? A URI goes away on its own with time. It cannot be cured with medicines, but medicines may be prescribed or recommended to relieve symptoms. Medicines may help:  Reduce your fever.  Reduce your cough.  Relieve nasal congestion. Follow these instructions at home:  Take medicines only as directed by your health care provider.  Gargle warm saltwater or take cough drops to comfort your throat as directed by your health care provider.  Use a warm mist humidifier or inhale steam from a shower to increase air moisture. This may make it easier to breathe.  Drink enough fluid to keep your urine clear or pale yellow.  Eat soups and other clear broths and maintain good nutrition.  Rest as needed.  Return to work when your temperature has returned to normal or as your health care provider advises. You may need to stay home longer to avoid infecting others. You can also use a face mask and careful hand washing to prevent spread of  the virus.  Increase the usage of your inhaler if you have asthma.  Do not use any tobacco products, including cigarettes, chewing tobacco, or electronic cigarettes. If you need help quitting, ask your health care provider. How is this prevented? The best way to protect yourself from getting a cold is to practice good hygiene.  Avoid oral or hand contact with people with cold symptoms.  Wash your hands often if contact occurs. There is no clear evidence that  vitamin C, vitamin E, echinacea, or exercise reduces the chance of developing a cold. However, it is always recommended to get plenty of rest, exercise, and practice good nutrition. Contact a health care provider if:  You are getting worse rather than better.  Your symptoms are not controlled by medicine.  You have chills.  You have worsening shortness of breath.  You have brown or red mucus.  You have yellow or brown nasal discharge.  You have pain in your face, especially when you bend forward.  You have a fever.  You have swollen neck glands.  You have pain while swallowing.  You have white areas in the back of your throat. Get help right away if:  You have severe or persistent:  Headache.  Ear pain.  Sinus pain.  Chest pain.  You have chronic lung disease and any of the following:  Wheezing.  Prolonged cough.  Coughing up blood.  A change in your usual mucus.  You have a stiff neck.  You have changes in your:  Vision.  Hearing.  Thinking.  Mood. This information is not intended to replace advice given to you by your health care provider. Make sure you discuss any questions you have with your health care provider. Document Released: 12/07/2000 Document Revised: 02/14/2016 Document Reviewed: 09/18/2013 Elsevier Interactive Patient Education  2017 [employer]    Sinusitis, Adult Sinusitis is soreness and inflammation of your sinuses. Sinuses are hollow spaces in the bones around your face. Your sinuses are located:  Around your eyes.  In the middle of your forehead.  Behind your nose.  In your cheekbones. Your sinuses and nasal passages are lined with a stringy fluid (mucus). Mucus normally drains out of your sinuses. When your nasal tissues become inflamed or swollen, the mucus can become trapped or blocked so air cannot flow through your sinuses. This allows bacteria, viruses, and funguses to grow, which leads to infection. Sinusitis can  develop quickly and last for 7?10 days (acute) or for more than 12 weeks (chronic). Sinusitis often develops after a cold. What are the causes? This condition is caused by anything that creates swelling in the sinuses or stops mucus from draining, including:  Allergies.  Asthma.  Bacterial or viral infection.  Abnormally shaped bones between the nasal passages.  Nasal growths that contain mucus (nasal polyps).  Narrow sinus openings.  Pollutants, such as chemicals or irritants in the air.  A foreign object stuck in the nose.  A fungal infection. This is rare. What increases the risk? The following factors may make you more likely to develop this condition:  Having allergies or asthma.  Having had a recent cold or respiratory tract infection.  Having structural deformities or blockages in your nose or sinuses.  Having a weak immune system.  Doing a lot of swimming or diving.  Overusing nasal sprays.  Smoking. What are the signs or symptoms? The main symptoms of this condition are pain and a feeling of pressure around the affected sinuses. Other symptoms  include:  Upper toothache.  Earache.  Headache.  Bad breath.  Decreased sense of smell and taste.  A cough that may get worse at night.  Fatigue.  Fever.  Thick drainage from your nose. The drainage is often green and it may contain pus (purulent).  Stuffy nose or congestion.  Postnasal drip. This is when extra mucus collects in the throat or back of the nose.  Swelling and warmth over the affected sinuses.  Sore throat.  Sensitivity to light. How is this diagnosed? This condition is diagnosed based on symptoms, a medical history, and a physical exam. To find out if your condition is acute or chronic, your health care provider may:  Look in your nose for signs of nasal polyps.  Tap over the affected sinus to check for signs of infection.  View the inside of your sinuses using an imaging device  that has a light attached (endoscope). If your health care provider suspects that you have chronic sinusitis, you may also:  Be tested for allergies.  Have a sample of mucus taken from your nose (nasal culture) and checked for bacteria.  Have a mucus sample examined to see if your sinusitis is related to an allergy. If your sinusitis does not respond to treatment and it lasts longer than 8 weeks, you may have an MRI or CT scan to check your sinuses. These scans also help to determine how severe your infection is. In rare cases, a bone biopsy may be done to rule out more serious types of fungal sinus disease. How is this treated? Treatment for sinusitis depends on the cause and whether your condition is chronic or acute. If a virus is causing your sinusitis, your symptoms will go away on their own within 10 days. You may be given medicines to relieve your symptoms, including:  Topical nasal decongestants. They shrink swollen nasal passages and let mucus drain from your sinuses.  Antihistamines. These drugs block inflammation that is triggered by allergies. This can help to ease swelling in your nose and sinuses.  Topical nasal corticosteroids. These are nasal sprays that ease inflammation and swelling in your nose and sinuses.  Nasal saline washes. These rinses can help to get rid of thick mucus in your nose. If your condition is caused by bacteria, you will be given an antibiotic medicine. If your condition is caused by a fungus, you will be given an antifungal medicine. Surgery may be needed to correct underlying conditions, such as narrow nasal passages. Surgery may also be needed to remove polyps. Follow these instructions at home: Medicines  Take, use, or apply over-the-counter and prescription medicines only as told by your health care provider. These may include nasal sprays.  If you were prescribed an antibiotic medicine, take it as told by your health care provider. Do not stop  taking the antibiotic even if you start to feel better. Hydrate and Humidify  Drink enough water to keep your urine clear or pale yellow. Staying hydrated will help to thin your mucus.  Use a cool mist humidifier to keep the humidity level in your home above 50%.  Inhale steam for 10-15 minutes, 3-4 times a day or as told by your health care provider. You can do this in the bathroom while a hot shower is running.  Limit your exposure to cool or dry air. Rest  Rest as much as possible.  Sleep with your head raised (elevated).  Make sure to get enough sleep each night. General instructions  Apply a warm, moist washcloth to your face 3-4 times a day or as told by your health care provider. This will help with discomfort.  Wash your hands often with soap and water to reduce your exposure to viruses and other germs. If soap and water are not available, use hand sanitizer.  Do not smoke. Avoid being around people who are smoking (secondhand smoke).  Keep all follow-up visits as told by your health care provider. This is important. Contact a health care provider if:  You have a fever.  Your symptoms get worse.  Your symptoms do not improve within 10 days. Get help right away if:  You have a severe headache.  You have persistent vomiting.  You have pain or swelling around your face or eyes.  You have vision problems.  You develop confusion.  Your neck is stiff.  You have trouble breathing. This information is not intended to replace advice given to you by your health care provider. Make sure you discuss any questions you have with your health care provider. Document Released: 06/13/2005 Document Revised: 02/07/2016 Document Reviewed: 04/08/2015 Elsevier Interactive Patient Education  2017 [employer].

## 2016-08-22 DIAGNOSIS — F900 Attention-deficit hyperactivity disorder, predominantly inattentive type: Secondary | ICD-10-CM | POA: Diagnosis not present

## 2016-08-23 ENCOUNTER — Ambulatory Visit (HOSPITAL_COMMUNITY): Payer: Self-pay | Admitting: Psychiatry

## 2016-09-12 DIAGNOSIS — F900 Attention-deficit hyperactivity disorder, predominantly inattentive type: Secondary | ICD-10-CM | POA: Diagnosis not present

## 2016-09-20 DIAGNOSIS — F900 Attention-deficit hyperactivity disorder, predominantly inattentive type: Secondary | ICD-10-CM | POA: Diagnosis not present

## 2016-10-04 DIAGNOSIS — F900 Attention-deficit hyperactivity disorder, predominantly inattentive type: Secondary | ICD-10-CM | POA: Diagnosis not present

## 2016-11-01 DIAGNOSIS — F900 Attention-deficit hyperactivity disorder, predominantly inattentive type: Secondary | ICD-10-CM | POA: Diagnosis not present

## 2016-11-10 ENCOUNTER — Encounter: Payer: Self-pay | Admitting: Family Medicine

## 2016-11-10 ENCOUNTER — Other Ambulatory Visit: Payer: Self-pay | Admitting: Family Medicine

## 2016-11-10 ENCOUNTER — Ambulatory Visit (INDEPENDENT_AMBULATORY_CARE_PROVIDER_SITE_OTHER): Payer: BLUE CROSS/BLUE SHIELD | Admitting: Family Medicine

## 2016-11-10 VITALS — BP 117/73 | HR 92 | Temp 98.7°F | Wt 158.0 lb

## 2016-11-10 DIAGNOSIS — Z0000 Encounter for general adult medical examination without abnormal findings: Secondary | ICD-10-CM

## 2016-11-10 DIAGNOSIS — Z789 Other specified health status: Secondary | ICD-10-CM | POA: Diagnosis not present

## 2016-11-10 DIAGNOSIS — F329 Major depressive disorder, single episode, unspecified: Secondary | ICD-10-CM

## 2016-11-10 DIAGNOSIS — F331 Major depressive disorder, recurrent, moderate: Secondary | ICD-10-CM

## 2016-11-10 DIAGNOSIS — F32A Depression, unspecified: Secondary | ICD-10-CM

## 2016-11-10 DIAGNOSIS — F419 Anxiety disorder, unspecified: Secondary | ICD-10-CM

## 2016-11-10 LAB — CBC
HCT: 38.7 % (ref 35.0–45.0)
Hemoglobin: 12.9 g/dL (ref 11.7–15.5)
MCH: 30.9 pg (ref 27.0–33.0)
MCHC: 33.3 g/dL (ref 32.0–36.0)
MCV: 92.8 fL (ref 80.0–100.0)
MPV: 10.2 fL (ref 7.5–12.5)
PLATELETS: 288 10*3/uL (ref 140–400)
RBC: 4.17 MIL/uL (ref 3.80–5.10)
RDW: 13.3 % (ref 11.0–15.0)
WBC: 3.8 10*3/uL (ref 3.8–10.8)

## 2016-11-10 LAB — IRON AND TIBC
%SAT: 28 % (ref 11–50)
IRON: 103 ug/dL (ref 40–190)
TIBC: 367 ug/dL (ref 250–450)
UIBC: 264 ug/dL

## 2016-11-10 LAB — TSH: TSH: 1.29 mIU/L

## 2016-11-10 NOTE — Progress Notes (Signed)
Suzanne JaegerChristen Stephenson is a 31 y.o. female who [facility] [place] [facility] [place] [facility] Medicine today for well exam.  Suzanne CoolerChristen is doing well overall. Her psychiatrist is prescribing ADHD medications. She is no longer taking any medicines for anxiety and depression and thinks these are pretty well controlled. She uses Flonase occasionally but otherwise does not take any other medications. She feels well otherwise.  Additionally she notes intolerance to hormonal contraceptives in the past. Currently she is using condoms and the pullout method for birth control. She is in a good relationship and would not be the end of the world if she became pregnant.   Her last Pap smear was about a year ago. It was reportedly normal.  Patient notes that she has been eating a feeding diet in the last year and is worried about vitamin and mineral deficiencies. She feels reasonably well but does have some fatigue sometimes. She notes that she will occasionally have heavy periods.   Past Medical History:  Diagnosis Date  . Anxiety   . Depression   . Headache(784.0)    No past surgical history on file. Social History  Substance Use Topics  . Smoking status: Former Smoker    Packs/day: 0.50    Years: 5.00    Types: Cigarettes    Quit date: 05/27/2016  . Smokeless tobacco: Former NeurosurgeonUser  . Alcohol use No   family history includes Alcohol abuse in her maternal aunt and paternal aunt; Cancer in her paternal aunt; Drug abuse in her sister.  ROS as above:  Medications: Current Outpatient Prescriptions  Medication Sig Dispense Refill  . amphetamine-dextroamphetamine (ADDERALL) 15 MG tablet Take 15 mg by mouth daily.    . fluticasone (FLONASE) 50 MCG/ACT nasal spray One spray in each nostril twice a day, use left hand for right nostril, and right hand for left nostril. 48 g 3   No current facility-administered  medications for this visit.    No Known Allergies  Health Maintenance Health Maintenance  Topic Date Due  . HIV Screening  03/10/2001  . INFLUENZA VACCINE  01/25/2017  . PAP SMEAR  09/25/2017  . TETANUS/TDAP  06/27/2024     Exam:  BP 117/73 (BP Location: Left Arm, Patient Position: Sitting, Cuff Size: Normal)   Pulse 92   Temp 98.7 F (37.1 C) (Oral)   Wt 158 lb (71.7 kg)   SpO2 100%   BMI 26.29 kg/m  Gen: Well NAD HEENT: EOMI,  MMM Lungs: Normal work of breathing. CTABL Heart: RRR no MRG Abd: NABS, Soft. Nondistended, Nontender Exts: Brisk capillary refill, warm and well perfused.  Psych alert and oriented normal speech thought process and affect.   No results found for this or any previous visit (from the past 72 hour(s)). No results found.    Assessment and Plan: 31 y.o. female with well adult visit. Doing reasonably well. We'll check basic fasting labs. Continue vigilance against worsening depression or anxiety. Urine GC chlamydia test today. Recheck in 1 year or sooner if needed.    Orders Placed This Encounter  Procedures  . CBC  . Iron and TIBC  . Ferritin  . TSH  . VITAMIN D 25 Hydroxy (Vit-D Deficiency, Fractures)  . Vitamin B12  . GC Probe amplification, urine   Meds ordered this encounter  Medications  . amphetamine-dextroamphetamine (ADDERALL) 15 MG tablet    Sig: Take 15 mg by mouth daily.     Discussed warning signs or symptoms.  Please see discharge instructions. Patient expresses understanding.

## 2016-11-10 NOTE — Patient Instructions (Signed)
Thank you for coming in today. Take 2 gummy vitamins or just folic acid daily.  We will check labs today.  Return in 1 year or sooner if needed.

## 2016-11-10 NOTE — Progress Notes (Signed)
Pt is here for annual exam.

## 2016-11-11 LAB — VITAMIN D 25 HYDROXY (VIT D DEFICIENCY, FRACTURES): Vit D, 25-Hydroxy: 33 ng/mL (ref 30–100)

## 2016-11-11 LAB — GC/CHLAMYDIA PROBE AMP
CT PROBE, AMP APTIMA: NOT DETECTED
GC Probe RNA: NOT DETECTED

## 2016-11-11 LAB — VITAMIN B12: VITAMIN B 12: 408 pg/mL (ref 200–1100)

## 2016-11-11 LAB — FERRITIN: Ferritin: 24 ng/mL (ref 10–154)

## 2016-11-11 LAB — NEISSERIA GONORRHOEAE, PROBE AMP: GC PROBE AMP APTIMA: NOT DETECTED

## 2016-11-15 DIAGNOSIS — F900 Attention-deficit hyperactivity disorder, predominantly inattentive type: Secondary | ICD-10-CM | POA: Diagnosis not present

## 2016-11-28 DIAGNOSIS — F900 Attention-deficit hyperactivity disorder, predominantly inattentive type: Secondary | ICD-10-CM | POA: Diagnosis not present

## 2016-12-13 DIAGNOSIS — F900 Attention-deficit hyperactivity disorder, predominantly inattentive type: Secondary | ICD-10-CM | POA: Diagnosis not present

## 2016-12-20 DIAGNOSIS — F900 Attention-deficit hyperactivity disorder, predominantly inattentive type: Secondary | ICD-10-CM | POA: Diagnosis not present

## 2016-12-27 DIAGNOSIS — F900 Attention-deficit hyperactivity disorder, predominantly inattentive type: Secondary | ICD-10-CM | POA: Diagnosis not present

## 2017-01-03 DIAGNOSIS — F900 Attention-deficit hyperactivity disorder, predominantly inattentive type: Secondary | ICD-10-CM | POA: Diagnosis not present

## 2017-02-28 ENCOUNTER — Ambulatory Visit (INDEPENDENT_AMBULATORY_CARE_PROVIDER_SITE_OTHER): Payer: BLUE CROSS/BLUE SHIELD

## 2017-02-28 ENCOUNTER — Ambulatory Visit (INDEPENDENT_AMBULATORY_CARE_PROVIDER_SITE_OTHER): Payer: BLUE CROSS/BLUE SHIELD | Admitting: Family Medicine

## 2017-02-28 ENCOUNTER — Encounter: Payer: Self-pay | Admitting: Family Medicine

## 2017-02-28 DIAGNOSIS — M5442 Lumbago with sciatica, left side: Secondary | ICD-10-CM

## 2017-02-28 DIAGNOSIS — M545 Low back pain: Secondary | ICD-10-CM | POA: Diagnosis not present

## 2017-02-28 LAB — POCT URINE PREGNANCY: Preg Test, Ur: NEGATIVE

## 2017-02-28 MED ORDER — PREDNISONE 5 MG (48) PO TBPK: ORAL_TABLET | ORAL | 0 refills | Status: AC

## 2017-02-28 MED ORDER — CYCLOBENZAPRINE HCL 10 MG PO TABS: 10.0000 mg | ORAL_TABLET | Freq: Three times a day (TID) | ORAL | 0 refills | Status: AC | PRN

## 2017-02-28 MED ORDER — HYDROCODONE-ACETAMINOPHEN 5-325 MG PO TABS: 1.0000 | ORAL_TABLET | Freq: Four times a day (QID) | ORAL | 0 refills | Status: AC | PRN

## 2017-02-28 NOTE — Progress Notes (Signed)
Suzanne Stephenson Stephenson is a 31 y.o. female who [facility] today for low back pain. Patient has a two-week history of worsening left lower back pain with pain radiating to the left lateral leg below the knee  She has pain is worse with sitting. She denies any weakness or numbness about bladder dysfunction or injury. She cannot think of any cause of the pain. She notes that she always has some mild back pain but it has worsened recently. She's been trying over-the-counter medicines for pain which do help. The pain is now interfering with sleep and work. No fevers or chills vomiting or diarrhea.   Past Medical History:  Diagnosis Date  . Anxiety   . Depression   . Headache(784.0)    No past surgical history on file. Social History  Substance Use Topics  . Smoking status: Former Smoker    Packs/day: 0.50    Years: 5.00    Types: Cigarettes    Quit date: 05/27/2016  . Smokeless tobacco: Former NeurosurgeonUser  . Alcohol use No     ROS:  As above   Medications: Current Outpatient Prescriptions  Medication Sig Dispense Refill  . amphetamine-dextroamphetamine (ADDERALL) 15 MG tablet Take 15 mg by mouth daily.    . fluticasone (FLONASE) 50 MCG/ACT nasal spray One spray in each nostril twice a day, use left hand for right nostril, and right hand for left nostril. 48 g 3  . cyclobenzaprine (FLEXERIL) 10 MG tablet Take 1 tablet (10 mg total) by mouth 3 (three) times daily as needed for muscle spasms. 30 tablet 0  . HYDROcodone-acetaminophen (NORCO/VICODIN) 5-325 MG tablet Take 1 tablet by mouth every 6 (six) hours as needed. 12 tablet 0  . predniSONE (STERAPRED UNI-PAK 48 TAB) 5 MG (48) TBPK tablet 12 day dosepack po 48 tablet 0   No current facility-administered medications for this visit.    No Known Allergies   Exam:  BP (!) 110/59   Pulse 97   Wt 140 lb (63.5 kg)   BMI 23.30 kg/m  General: Well Developed, well nourished, and in no  acute distress.  Neuro/Psych: Alert and oriented x3, extra-ocular muscles intact, able to move all 4 extremities, sensation grossly intact. Skin: Warm and dry, no rashes noted.  Respiratory: Not using accessory muscles, speaking in full sentences, trachea midline.  Cardiovascular: Pulses palpable, no extremity edema. Abdomen: Does not appear distended. MSK:  L spine: Nontender to spinal midline. Tender to palpation bilateral lumbar paraspinal muscles. Lumbar spine normal motion however patient experiences pain with rotation and lateral flexion.  Negative slump test right positive left. Reflexes sensation and strength are equal and normal throughout.    Results for orders placed or performed in visit on 02/28/17 (from the past 48 hour(s))  POCT urine pregnancy     Status: None   Collection Time: 02/28/17  3:22 PM  Result Value Ref Range   Preg Test, Ur Negative Negative   Dg Lumbar Spine Complete  Result Date: 02/28/2017 CLINICAL DATA:  Acute left-sided low back pain with left-sided sciatica for 3 weeks. EXAM: LUMBAR SPINE - COMPLETE 4+ VIEW COMPARISON:  None. FINDINGS: There is no evidence of lumbar spine fracture. Alignment is normal. Intervertebral disc spaces are maintained. No other osseous abnormality identified. IMPRESSION: Negative. Electronically Signed   By: Suzanne Stephenson  Stephenson M.D.   On: 02/28/2017 16:20      Assessment and Plan: 31 y.o. female with left-sided lumbago with likely L5 radiculopathy. X-ray unremarkable  today. Symptoms likely due to myofascial spasm and potential pinched nerve with bulging disc. Plan for prednisone Dosepak Flexeril referral to physical therapy as well as Norco for pain control. Recheck in 3-6 weeks.   Patient researched [facility] Controlled Substance Reporting System.   Orders Placed This Encounter  Procedures  . DG Lumbar Spine Complete    Standing Status:   Future    Number of Occurrences:   1    Standing Expiration Date:   04/30/2018     Order Specific Question:   Reason for Exam (SYMPTOM  OR DIAGNOSIS REQUIRED)    Answer:   eval lumbar pain with likely Lt l5 radiuclar pain    Order Specific Question:   Is patient pregnant?    Answer:   No    Order Specific Question:   Preferred imaging location?    Answer:   [facility]    Order Specific Question:   Radiology Contrast Protocol - do NOT remove file path    Answer:   \\[email or web address]  . Ambulatory referral to Physical Therapy    Referral Priority:   Routine    Referral Type:   Physical Medicine    Referral Reason:   Specialty Services Required    Requested Specialty:   Physical Therapy  . POCT urine pregnancy   Meds ordered this encounter  Medications  . predniSONE (STERAPRED UNI-PAK 48 TAB) 5 MG (48) TBPK tablet    Sig: 12 day dosepack po    Dispense:  48 tablet    Refill:  0  . cyclobenzaprine (FLEXERIL) 10 MG tablet    Sig: Take 1 tablet (10 mg total) by mouth 3 (three) times daily as needed for muscle spasms.    Dispense:  30 tablet    Refill:  0  . HYDROcodone-acetaminophen (NORCO/VICODIN) 5-325 MG tablet    Sig: Take 1 tablet by mouth every 6 (six) hours as needed.    Dispense:  12 tablet    Refill:  0    Discussed warning signs or symptoms. Please see discharge instructions. Patient expresses understanding.

## 2017-02-28 NOTE — Patient Instructions (Signed)
Thank you for coming in today. Attend PT.  Get xray today.  Take flexeril at bedtime as needed.  Take prednisone for 12 days.  Use norco sparingly.  Come back or go to the emergency room if you notice new weakness new numbness problems walking or bowel or bladder problems.  Recheck with me in 3-6 weeks if not better.    Sciatica Sciatica is pain, numbness, weakness, or tingling along the path of the sciatic nerve. The sciatic nerve starts in the lower back and runs down the back of each leg. The nerve controls the muscles in the lower leg and in the back of the knee. It also provides feeling (sensation) to the back of the thigh, the lower leg, and the sole of the foot. Sciatica is a symptom of another medical condition that pinches or puts pressure on the sciatic nerve. Generally, sciatica only affects one side of the body. Sciatica usually goes away on its own or with treatment. In some cases, sciatica may keep coming back (recur). What are the causes? This condition is caused by pressure on the sciatic nerve, or pinching of the sciatic nerve. This may be the result of:  A disk in between the bones of the spine (vertebrae) bulging out too far (herniated disk).  Age-related changes in the spinal disks (degenerative disk disease).  A pain disorder that affects a muscle in the buttock (piriformis syndrome).  Extra bone growth (bone spur) near the sciatic nerve.  An injury or break (fracture) of the pelvis.  Pregnancy.  Tumor (rare).  What increases the risk? The following factors may make you more likely to develop this condition:  Playing sports that place pressure or stress on the spine, such as football or weight lifting.  Having poor strength and flexibility.  A history of back injury.  A history of back surgery.  Sitting for long periods of time.  Doing activities that involve repetitive bending or lifting.  Obesity.  What are the signs or symptoms? Symptoms can  vary from mild to very severe, and they may include:  Any of these problems in the lower back, leg, hip, or buttock: ? Mild tingling or dull aches. ? Burning sensations. ? Sharp pains.  Numbness in the back of the calf or the sole of the foot.  Leg weakness.  Severe back pain that makes movement difficult.  These symptoms may get worse when you cough, sneeze, or laugh, or when you sit or stand for long periods of time. Being overweight may also make symptoms worse. In some cases, symptoms may recur over time. How is this diagnosed? This condition may be diagnosed based on:  Your symptoms.  A physical exam. Your health care provider may ask you to do certain movements to check whether those movements trigger your symptoms.  You may have tests, including: ? Blood tests. ? X-rays. ? MRI. ? CT scan.  How is this treated? In many cases, this condition improves on its own, without any treatment. However, treatment may include:  Reducing or modifying physical activity during periods of pain.  Exercising and stretching to strengthen your abdomen and improve the flexibility of your spine.  Icing and applying heat to the affected area.  Medicines that help: ? To relieve pain and swelling. ? To relax your muscles.  Injections of medicines that help to relieve pain, irritation, and inflammation around the sciatic nerve (steroids).  Surgery.  Follow these instructions at home: Medicines  Take over-the-counter and prescription medicines  only as told by your health care provider.  Do not drive or operate heavy machinery while taking prescription pain medicine. Managing pain  If directed, apply ice to the affected area. ? Put ice in a plastic bag. ? Place a towel between your skin and the bag. ? Leave the ice on for 20 minutes, 2-3 times a day.  After icing, apply heat to the affected area before you exercise or as often as told by your health care provider. Use the heat  source that your health care provider recommends, such as a moist heat pack or a heating pad. ? Place a towel between your skin and the heat source. ? Leave the heat on for 20-30 minutes. ? Remove the heat if your skin turns bright red. This is especially important if you are unable to feel pain, heat, or cold. You may have a greater risk of getting burned. Activity  Return to your normal activities as told by your health care provider. Ask your health care provider what activities are safe for you. ? Avoid activities that make your symptoms worse.  Take brief periods of rest throughout the day. Resting in a lying or standing position is usually better than sitting to rest. ? When you rest for longer periods, mix in some mild activity or stretching between periods of rest. This will help to prevent stiffness and pain. ? Avoid sitting for long periods of time without moving. Get up and move around at least one time each hour.  Exercise and stretch regularly, as told by your health care provider.  Do not lift anything that is heavier than 10 lb (4.5 kg) while you have symptoms of sciatica. When you do not have symptoms, you should still avoid heavy lifting, especially repetitive heavy lifting.  When you lift objects, always use proper lifting technique, which includes: ? Bending your knees. ? Keeping the load close to your body. ? Avoiding twisting. General instructions  Use good posture. ? Avoid leaning forward while sitting. ? Avoid hunching over while standing.  Maintain a healthy weight. Excess weight puts extra stress on your back and makes it difficult to maintain good posture.  Wear supportive, comfortable shoes. Avoid wearing high heels.  Avoid sleeping on a mattress that is too soft or too hard. A mattress that is firm enough to support your back when you sleep may help to reduce your pain.  Keep all follow-up visits as told by your health care provider. This is  important. Contact a health care provider if:  You have pain that wakes you up when you are sleeping.  You have pain that gets worse when you lie down.  Your pain is worse than you have experienced in the past.  Your pain lasts longer than 4 weeks.  You experience unexplained weight loss. Get help right away if:  You lose control of your bowel or bladder (incontinence).  You have: ? Weakness in your lower back, pelvis, buttocks, or legs that gets worse. ? Redness or swelling of your back. ? A burning sensation when you urinate. This information is not intended to replace advice given to you by your health care provider. Make sure you discuss any questions you have with your health care provider. Document Released: 06/07/2001 Document Revised: 11/17/2015 Document Reviewed: 02/20/2015 Elsevier Interactive Patient Education  2017 [employer]

## 2017-03-07 ENCOUNTER — Ambulatory Visit: Payer: Self-pay | Admitting: Rehabilitative and Restorative Service Providers"

## 2017-03-08 ENCOUNTER — Other Ambulatory Visit: Payer: Self-pay | Admitting: Family Medicine

## 2017-03-10 ENCOUNTER — Encounter: Payer: Self-pay | Admitting: Physical Therapy

## 2017-03-10 ENCOUNTER — Ambulatory Visit (INDEPENDENT_AMBULATORY_CARE_PROVIDER_SITE_OTHER): Payer: BLUE CROSS/BLUE SHIELD | Admitting: Physical Therapy

## 2017-03-10 DIAGNOSIS — M5442 Lumbago with sciatica, left side: Secondary | ICD-10-CM

## 2017-03-10 DIAGNOSIS — M6281 Muscle weakness (generalized): Secondary | ICD-10-CM | POA: Diagnosis not present

## 2017-03-10 NOTE — Therapy (Signed)
[facility] [address] [place], [place], [address] Phone: [phone]   Fax:  [phone]  Physical Therapy Evaluation  Patient Details  Name: Suzanne Stephenson MRN: [id number] Date of Birth: 07-25-1985 Referring Provider: Dr Clementeen Graham  Encounter Date: 03/10/2017      PT End of Session - 03/10/17 0852    Visit Number 1   Number of Visits 8   Date for PT Re-Evaluation 04/21/17   PT Start Time 0852   PT Stop Time 0940   PT Time Calculation (min) 48 min   Activity Tolerance Patient tolerated treatment well      Past Medical History:  Diagnosis Date  . Anxiety   . Depression   . Headache(784.0)     History reviewed. No pertinent surgical history.  There were no vitals filed for this visit.       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  She has 10-12 hours a day on her feet. The pain has continued and is limiting her ability to work, sleep and doing daily acitivities.  Pain will move down into her Lt leg to calf. Performs some exercise, walking and stretching.    How long can you sit comfortably? < 5'   How long can you stand comfortably? <5'   How long can you walk comfortably? feels like walking improves the pain a little   Diagnostic tests x-rays - (-)    Patient Stated Goals strengthen her back and get rid of pain   Currently in Pain? Yes   Pain Score 5    Pain Location Back   Pain Orientation Left   Pain Descriptors / Indicators Tightness;Sharp;Shooting   Pain Type Acute pain   Pain Radiating Towards ome into Lt LE    Pain Onset 1 to 4 weeks ago   Pain Frequency Constant   Aggravating Factors  work, standing   Pain Relieving Factors heat and ice, meds            [facility] PT Assessment - 03/10/17 0001      Assessment   Medical Diagnosis Acute Lt low back pain   Referring Provider Dr Clementeen Graham   Onset Date/Surgical  Date 02/07/17   Hand Dominance Right   Next MD Visit after PT   Prior Therapy not for low back, yes for cervical 2-3 yrs ago.      Precautions   Precautions None     Balance Screen   Has the patient fallen in the past 6 months No     Prior Function   Level of Independence Independent   Vocation Full time employment   Vocation Requirements hairdresser     Observation/Other Assessments   Focus on Therapeutic Outcomes (FOTO)  53% limited     Functional Tests   Functional tests Squat;Single leg stance     Squat   Comments WNL     Single Leg Stance   Comments WNL bilat, pain with Lt side     Posture/Postural Control   Posture/Postural Control Postural limitations   Postural Limitations Forward head;Rounded Shoulders;Decreased lumbar lordosis     ROM / Strength   AROM / PROM / Strength AROM;Strength     AROM   AROM Assessment Site Lumbar   Lumbar Flexion to floor   Lumbar Extension WNL, pinching pain Lt SIJ area   Lumbar - Right Side Bend 75% present, pulling on  Lt side   Lumbar - Left Side Bend 90% present   Lumbar - Right Rotation 50% present with lots of Lt sided LBP   Lumbar - Left Rotation WNL     Strength   Strength Assessment Site Hip;Knee;Ankle;Lumbar   Right/Left Hip Right;Left   Right Hip Flexion --  5-/5   Right Hip Extension 4/5   Right Hip ABduction 4/5   Left Hip Flexion 4+/5   Left Hip Extension 4/5   Left Hip ABduction 4-/5   Right/Left Knee Left  Rt 5/5   Left Knee Flexion 5/5   Left Knee Extension 4+/5   Right/Left Ankle --  Rt WNl, Lt 4/5   Lumbar Flexion --  TA fair (+)    Lumbar Extension --  multifidi poor Rt, fair (-) Lt     Flexibility   Soft Tissue Assessment /Muscle Length yes   Hamstrings supine SLR Rt 88, Lt 84   Quadriceps WNL   Piriformis pt reports feelings of tightness, good ROM   Quadratus Lumborum tight bilat     Palpation   Spinal mobility pain with CPA mobs at L3, bilat UPA mobs L4-3   Palpation comment tender in  bilat gluts and lower lumbar paraspinals     Special Tests    Special Tests Lumbar   Lumbar Tests Slump Test;Straight Leg Raise     Slump test   Findings Positive   Side Left   Comment and Rt side            Objective measurements completed on examination: See above findings.          [facility] Adult PT Treatment/Exercise - 03/10/17 0001      Exercises   Exercises Lumbar     Lumbar Exercises: Stretches   Double Knee to Chest Stretch 30 seconds     Lumbar Exercises: Supine   Other Supine Lumbar Exercises 20reps pilates table top with heel touch downs     Lumbar Exercises: Prone   Other Prone Lumbar Exercises pelvic press series lower and upper                     PT Long Term Goals - 03/10/17 7829      PT LONG TERM GOAL #1   Title I with advanced HEP ( 04/21/17)    Time 6   Period Weeks   Status New     PT LONG TERM GOAL #2   Title report =/> 75% reduction in low back pain with daily activities ( 04/21/17)    Time 6   Period Weeks   Status New     PT LONG TERM GOAL #3   Title improve FOTO =/< 27% limited ( 04/21/17)    Time 6   Period Weeks   Status New     PT LONG TERM GOAL #4   Title demo strong contraction of bilat multifidi ( 04/21/17)    Time 6   Period Weeks   Status New                Plan - 03/10/17 0940    Clinical Impression Statement 31 yo female presents with ~ 2 -4 wk h/o LBP with symptoms into her Lt LE. She is on her feet all day long at work and is having increased pain at the end of his shift. She has hyperflexibility and mobility in her joints that are causing spinal instability and most likely pressure on the nerves.  Tawanna Cooler  also has some muscular tightness in her QL's and lumbar paraspinals.    Clinical Presentation Evolving   Clinical Decision Making Low   Rehab Potential Excellent   PT Frequency 2x / week  reducing to 1x/wk near end of POC   PT Duration 6 weeks   PT Treatment/Interventions Moist  Heat;Ultrasound;Therapeutic exercise;Dry needling;Manual techniques;Electrical Stimulation;Cryotherapy;Patient/family education   PT Next Visit Plan manual work/DN to QL, lumbar paraspinals, core stability ex.    Consulted and Agree with Plan of Care Patient      Patient will benefit from skilled therapeutic intervention in order to improve the following deficits and impairments:  Pain, Decreased strength, Decreased activity tolerance, Hypermobility  Visit Diagnosis: Acute left-sided low back pain with left-sided sciatica  Muscle weakness (generalized)     Problem List Patient Active Problem List   Diagnosis Date Noted  . Lumbago with sciatica, left side 02/28/2017  . Headache 11/26/2015  . Major depressive disorder, recurrent episode, moderate (HCC) 01/14/2014  . Anxiety and depression 01/14/2014    Darl Pikes Rene Gonsoulin PT  03/10/2017, 10:08 AM  [facility] [address] [place], [place], [address] Phone: [phone]   Fax:  [phone]  Name: Monetta Lick MRN: [id number] Date of Birth: 09-22-1985

## 2017-03-10 NOTE — Patient Instructions (Addendum)
Pelvic Press                                                     Pain needs to stay 4-5/10 or lower, if it goes higher stop the exercise.    Place hands under belly between navel and pubic bone, palms up. Feel pressure on hands. Increase pressure on hands by pressing pelvis down. This is NOT a pelvic tilt. Hold __5_ seconds. Relax. Repeat _10__ times. Once a day.  KNEE: Flexion - Prone - keep hands under hips   Hold pelvic press. Bend knee, then return the foot down. Repeat on opposite leg. Do not raise hips. _10__ reps per set. When this is mastered, pull both heels up at same time, x 10 reps.  Once a day  Leg Lift: One-Leg   Press pelvis down. Keep knee straight; lengthen and lift one leg (from waist). Do not twist body. Keep other leg down. Hold _1__ seconds. Relax. Repeat 5-10 time. Repeat with other leg.  HIP: Extension / KNEE: Flexion - Prone - hands under hips    Hold pelvic press. Bend knee, squeeze glutes. Raise leg up  5-10___ reps per set, _1__ sets per day, _1__ time a day.   Axial Extension- Upper body sequence * always start with pelvic press    Lie on stomach with forehead resting on floor and arms at sides. Tuck chin in and raise head from floor without bending it up or down. Repeat ___5-10_ times per set. Do __1__ sets per session. Do _1___ sessions per day.  Progression:  Arms at side Arms in T shape Arms in W shape ( goal posts)  Arms in M shape  ( push up)  Arms in Y shape  ( superman)   Knee-to-Chest Stretch: Bilateral    With hands behind knees, pull both knees in to chest until a comfortable stretch is felt in lower back and buttocks. Keep back relaxed. Hold _20-30___ seconds. Repeat __1__ times per set. Do _1___ sets per session. Do ___1_ sessions per day.  Toe Touch    Lie on back, legs folded to chest, arms by sides. Exhale, lowering leg to just touch toes to mat. Inhale, returning knee to chest. Keep abdominals flat, navel to spine. Repeat  __2x10__ times, alternating legs. Do __1__ sessions per day.  [facility] [address] [place], [place] [address]  [phone] (office) [phone] (fax)   Trigger Point Dry Needling  . What is Trigger Point Dry Needling (DN)? o DN is a physical therapy technique used to treat muscle pain and dysfunction. Specifically, DN helps deactivate muscle trigger points (muscle knots).  o A thin filiform needle is used to penetrate the skin and stimulate the underlying trigger point. The goal is for a local twitch response (LTR) to occur and for the trigger point to relax. No medication of any kind is injected during the procedure.   . What Does Trigger Point Dry Needling Feel Like?  o The procedure feels different for each individual patient. Some patients report that they do not actually feel the needle enter the skin and overall the process is not painful. Very mild bleeding may occur. However, many patients feel a deep cramping in the muscle in which the needle was inserted. This is the local twitch  response.   Marland Kitchen How Will I feel after the treatment? o Soreness is normal, and the onset of soreness may not occur for a few hours. Typically this soreness does not last longer than two days.  o Bruising is uncommon, however; ice can be used to decrease any possible bruising.  o In rare cases feeling tired or nauseous after the treatment is normal. In addition, your symptoms may get worse before they get better, this period will typically not last longer than 24 hours.   . What Can I do After My Treatment? o Increase your hydration by drinking more water for the next 24 hours. o You may place ice or heat on the areas treated that have become sore, however, do not use heat on inflamed or bruised areas. Heat often brings more relief post needling. o You can continue your regular activities, but vigorous activity is not recommended initially after  the treatment for 24 hours. o DN is best combined with other physical therapy such as strengthening, stretching, and other therapies.

## 2017-03-13 ENCOUNTER — Ambulatory Visit (INDEPENDENT_AMBULATORY_CARE_PROVIDER_SITE_OTHER): Payer: BLUE CROSS/BLUE SHIELD | Admitting: Physical Therapy

## 2017-03-13 ENCOUNTER — Encounter: Payer: Self-pay | Admitting: Physical Therapy

## 2017-03-13 DIAGNOSIS — M6281 Muscle weakness (generalized): Secondary | ICD-10-CM

## 2017-03-13 DIAGNOSIS — M5442 Lumbago with sciatica, left side: Secondary | ICD-10-CM

## 2017-03-13 NOTE — Therapy (Signed)
[place], [place], [address] Phone: [phone]   Fax:  [phone]  Physical Therapy Treatment  Patient Details  Name: Suzanne Stephenson MRN: [id number] Date of Birth: 1986/04/11 Referring Provider: Dr Lynne Leader  Encounter Date: 03/13/2017      PT End of Session - 03/13/17 1107    Visit Number 2   Number of Visits 8   Date for PT Re-Evaluation 04/21/17   PT Start Time 1107   PT Stop Time 5597   PT Time Calculation (min) 49 min   Activity Tolerance Patient tolerated treatment well      Past Medical History:  Diagnosis Date  . Anxiety   . Depression   . Headache(784.0)     History reviewed. No pertinent surgical history.  There were no vitals filed for this visit.      Subjective Assessment - 03/13/17 1107    Subjective pumped water out of her basement this AM.  Doing well with HEP    Patient Stated Goals strengthen her back and get rid of pain   Currently in Pain? Yes   Pain Score 4    Pain Location Back   Pain Orientation Left   Pain Descriptors / Indicators Sore   Pain Type Acute pain   Pain Radiating Towards not going down into the leg lately.    Pain Onset 1 to 4 weeks ago   Pain Frequency Intermittent   Aggravating Factors  standing and work   Pain Relieving Factors heat and ice, meds                         [facility] Adult PT Treatment/Exercise - 03/13/17 0001      Lumbar Exercises: Aerobic   UBE (Upper Arm Bike) standing L2x4' alt FWD/BWD     Lumbar Exercises: Supine   Other Supine Lumbar Exercises 10 reps CW/CCW pilates leg circles, then leg kicks with 2 pulses     Lumbar Exercises: Prone   Opposite Arm/Leg Raise Left arm/Right leg;Right arm/Left leg;20 reps  VC for form   Other Prone Lumbar Exercises lower body lift with heel taps, 4x5reps     Modalities   Modalities Moist Heat;Electrical Stimulation     Moist Heat Therapy   Number Minutes  Moist Heat 15 Minutes   Moist Heat Location Lumbar Spine     Electrical Stimulation   Electrical Stimulation Location Lt low back   Electrical Stimulation Action IFC   Electrical Stimulation Parameters to tolerance   Electrical Stimulation Goals Pain;Tone     Manual Therapy   Manual Therapy Soft tissue mobilization   Soft tissue mobilization Lt lumbar paraspinals , QL STM with TPR           Trigger Point Dry Needling - 03/13/17 1113    Consent Given? Yes   Education Handout Provided Yes   Muscles Treated Upper Body Quadratus Lumborum;Longissimus  Lt side with good twitch respone in QL   Longissimus Response Palpable increased muscle length;Twitch response elicited  lumbar                   PT Long Term Goals - 03/13/17 1115      PT LONG TERM GOAL #1   Title I with advanced HEP ( 04/21/17)    Status On-going     PT LONG TERM GOAL #2   Title report =/> 75% reduction in low back pain with daily activities (  04/21/17)    Status On-going     PT LONG TERM GOAL #3   Title improve FOTO =/< 27% limited ( 04/21/17)    Status On-going     PT LONG TERM GOAL #4   Title demo strong contraction of bilat multifidi ( 04/21/17)    Status On-going               Plan - 03/13/17 1116    Clinical Impression Statement This is [name]'s second visit, no goals met.  She has had a slight decrease in Lt LE sypmtoms.  No goals met.  Did very well with stabilizing pelvis during core work and had increased feelings of mobility after manual work.    Rehab Potential Excellent   PT Frequency 2x / week   PT Duration 6 weeks   PT Treatment/Interventions Moist Heat;Ultrasound;Therapeutic exercise;Dry needling;Manual techniques;Electrical Stimulation;Cryotherapy;Patient/family education   PT Next Visit Plan assess response to manual work and dry needling   Consulted and Agree with Plan of Care Patient      Patient will benefit from skilled therapeutic intervention in order to  improve the following deficits and impairments:  Pain, Decreased strength, Decreased activity tolerance, Hypermobility  Visit Diagnosis: Acute left-sided low back pain with left-sided sciatica  Muscle weakness (generalized)     Problem List Patient Active Problem List   Diagnosis Date Noted  . Lumbago with sciatica, left side 02/28/2017  . Headache 11/26/2015  . Major depressive disorder, recurrent episode, moderate ([place]) 01/14/2014  . Anxiety and depression 01/14/2014    Jeral Pinch PT  03/13/2017, 11:45 AM  [facility] [place], [place], [address] Phone: [phone]   Fax:  [phone]  Name: Suzanne Stephenson MRN: [id number] Date of Birth: [birth year]

## 2017-03-13 NOTE — Addendum Note (Signed)
Addended by: Jacki Couse E on: 03/13/2017 11:12 AM   Modules accepted: Orders

## 2017-03-20 ENCOUNTER — Ambulatory Visit (INDEPENDENT_AMBULATORY_CARE_PROVIDER_SITE_OTHER): Payer: BLUE CROSS/BLUE SHIELD | Admitting: Physical Therapy

## 2017-03-20 DIAGNOSIS — M6281 Muscle weakness (generalized): Secondary | ICD-10-CM | POA: Diagnosis not present

## 2017-03-20 DIAGNOSIS — M5442 Lumbago with sciatica, left side: Secondary | ICD-10-CM

## 2017-03-20 NOTE — Patient Instructions (Signed)
Single Leg Circle    Lie on back, one leg bent, other leg straight up. Inhale, circling leg across body, and exhale while circling down and around to beginning. Maintain still pelvis; avoid rocking. Keep circle small.Repeat _10___ times clockwise, then counterclockwise. Repeat with other leg. Do __2__ sessions per day.  Arm / Leg Lift: Opposite (Prone)    Lift right leg and opposite arm __3__ inches from floor, keeping knee locked. Repeat __10__ times per set. Do __2__ sets per session. Do __1__ sessions per day.  TENS UNIT  This is helpful for muscle pain and spasm.   Search and Purchase a TENS 7000 2nd edition at www.tenspros.com or www.amazon.com  (It should be less than $30)     TENS unit instructions:   Do not shower or bathe with the unit on  Turn the unit off before removing electrodes or batteries  If the electrodes lose stickiness add a drop of water to the electrodes after they are disconnected from the unit and place on plastic sheet. If you continued to have difficulty, call the TENS unit company to purchase more electrodes.  Do not apply lotion on the skin area prior to use. Make sure the skin is clean and dry as this will help prolong the life of the electrodes.  After use, always check skin for unusual red areas, rash or other skin difficulties. If there are any skin problems, does not apply electrodes to the same area.  Never remove the electrodes from the unit by pulling the wires.  Do not use the TENS unit or electrodes other than as directed.  Do not change electrode placement without consulting your therapist or physician.  Keep 2 fingers with between each electrode.

## 2017-03-20 NOTE — Therapy (Addendum)
[facility] [place] [address] [place], [place], [address] Phone: [phone]   Fax:  [phone]  Physical Therapy Treatment  Patient Details  Name: Suzanne Stephenson MRN: [id number] Date of Birth: [birth year] Referring Provider: Dr. Clementeen Graham   Encounter Date: 03/20/2017      PT End of Session - 03/20/17 1153    Visit Number 3   Number of Visits 8   Date for PT Re-Evaluation 04/21/17   PT Start Time 1105   PT Stop Time 1207   PT Time Calculation (min) 62 min   Activity Tolerance Patient tolerated treatment well   Behavior During Therapy [facility] for tasks assessed/performed      Past Medical History:  Diagnosis Date  . Anxiety   . Depression   . Headache(784.0)     No past surgical history on file.  There were no vitals filed for this visit.      Subjective Assessment - 03/20/17 1109    Subjective Pt reports she has been busy lately at work, standing 12 hours at a time.  She reports 30% improvement since initial eval.  She reports she felt a good release / relief after DN last session.     Patient Stated Goals strengthen her back and get rid of pain   Currently in Pain? Yes   Pain Score 3    Pain Location Back   Pain Orientation Left;Lower   Pain Descriptors / Indicators Sore   Aggravating Factors  standing/sitting for prolonged periods   Pain Relieving Factors heat/ ice/ OTC meds             [facility] PT Assessment - 03/20/17 0001      Assessment   Medical Diagnosis Acute Lt low back pain   Referring Provider Dr. Clementeen Graham    Onset Date/Surgical Date 02/07/17   Hand Dominance Right   Next MD Visit not scheduled yet.             [facility] Adult PT Treatment/Exercise - 03/20/17 0001      Self-Care   Self-Care Other Self-Care Comments   Other Self-Care Comments  Pt given information regarding TENS unit, rationale.      Lumbar Exercises: Stretches   Quadruped Mid Back Stretch 2 reps;30 seconds  childs  pose with lateral trunk flexion   Quadruped Mid Back Stretch Limitations yoga pose, thread the needle x 15 sec each side.      Lumbar Exercises: Aerobic   UBE (Upper Arm Bike) standing L2x4' alt FWD/BWD     Lumbar Exercises: Supine   Other Supine Lumbar Exercises Pilates heel taps, Leg circles CW/CCW.        Lumbar Exercises: Prone   Opposite Arm/Leg Raise Right arm/Left leg;Left arm/Right leg;5 reps  2 sets with axial ext   Other Prone Lumbar Exercises lower body lift with heel taps, 4x5reps   Other Prone Lumbar Exercises pelvic press x 5 sec x 5 reps, then pelvic press with hip ext (knee bent) x 5 reps each leg, hip ext (knee straight) x 5 reps each leg.       Moist Heat Therapy   Number Minutes Moist Heat 15 Minutes   Moist Heat Location Lumbar Spine, Lt ant hip     Electrical Stimulation   Electrical Stimulation Location bilat low back; Lt hip (ant/post)   Electrical Stimulation Action pre mod to each area   Electrical Stimulation Parameters to tolerance    Electrical Stimulation Goals Tone;Pain  Manual Therapy   Manual Therapy Soft tissue mobilization   Soft tissue mobilization Rt/Lt lumbar paraspinals, QL, Rt iliopsoas                      PT Long Term Goals - 03/20/17 1112      PT LONG TERM GOAL #1   Title I with advanced HEP ( 04/21/17)    Time 6   Period Weeks   Status On-going     PT LONG TERM GOAL #2   Title report =/> 75% reduction in low back pain with daily activities ( 04/21/17)    Time 6   Period Weeks   Status On-going     PT LONG TERM GOAL #3   Title improve FOTO =/< 27% limited ( 04/21/17)    Time 6   Period Weeks   Status On-going     PT LONG TERM GOAL #4   Title demo strong contraction of bilat multifidi ( 04/21/17)    Time 6   Period Weeks   Status On-going               Plan - 03/20/17 1224    Clinical Impression Statement Lt multfidi is slow to fire with pelvic press series, Rt side is strong. She was able to  complete core/spine stabilization exercises without increase in symptoms.  Rt iliopsoas and QL had more palpable tightness/tenderness, as well as Lt prox/lateral quad with manual therapy today.  Pt reported reduction in symptoms wiht use of estim/MHP at end of session. Pt has ordered TENS unit; may bring in for further operation instruction.  Pt progressing towards goals.     Rehab Potential Excellent   PT Frequency 2x / week   PT Duration 6 weeks   PT Treatment/Interventions Moist Heat;Ultrasound;Therapeutic exercise;Dry needling;Manual techniques;Electrical Stimulation;Cryotherapy;Patient/family education   PT Next Visit Plan Assess tightness of Rt/Lt hip. DN.  Progress spinal stabilization.    Consulted and Agree with Plan of Care Patient      Patient will benefit from skilled therapeutic intervention in order to improve the following deficits and impairments:  Pain, Decreased strength, Decreased activity tolerance, Hypermobility  Visit Diagnosis: Acute left-sided low back pain with left-sided sciatica  Muscle weakness (generalized)     Problem List Patient Active Problem List   Diagnosis Date Noted  . Lumbago with sciatica, left side 02/28/2017  . Headache 11/26/2015  . Major depressive disorder, recurrent episode, moderate ([facility]) 01/14/2014  . Anxiety and depression 01/14/2014   Mayer Camel, PTA 03/20/17 12:28 PM  [facility] [place] [address] [place], [place], [address] Phone: [phone]   Fax:  [phone]  Name: Suzanne Stephenson MRN: [id number] Date of Birth: Oct 16, 1985

## 2017-03-21 DIAGNOSIS — Z6827 Body mass index (BMI) 27.0-27.9, adult: Secondary | ICD-10-CM | POA: Diagnosis not present

## 2017-03-21 DIAGNOSIS — Z01419 Encounter for gynecological examination (general) (routine) without abnormal findings: Secondary | ICD-10-CM | POA: Diagnosis not present

## 2017-03-22 DIAGNOSIS — Z1151 Encounter for screening for human papillomavirus (HPV): Secondary | ICD-10-CM | POA: Diagnosis not present

## 2017-03-22 DIAGNOSIS — Z01419 Encounter for gynecological examination (general) (routine) without abnormal findings: Secondary | ICD-10-CM | POA: Diagnosis not present

## 2017-03-23 ENCOUNTER — Encounter: Payer: Self-pay | Admitting: Physical Therapy

## 2017-03-23 ENCOUNTER — Telehealth: Payer: Self-pay | Admitting: Physical Therapy

## 2017-03-23 NOTE — Telephone Encounter (Signed)
Called patient and left message.  She missed todays appointment and was making sure everything was ok, requested pt call us with any concerns.  Next scheduled appointment is 04/05/17

## 2017-04-05 ENCOUNTER — Encounter: Payer: BLUE CROSS/BLUE SHIELD | Admitting: Physical Therapy

## 2017-04-10 ENCOUNTER — Ambulatory Visit (INDEPENDENT_AMBULATORY_CARE_PROVIDER_SITE_OTHER): Payer: BLUE CROSS/BLUE SHIELD | Admitting: Physical Therapy

## 2017-04-10 ENCOUNTER — Encounter: Payer: Self-pay | Admitting: Physical Therapy

## 2017-04-10 DIAGNOSIS — M5442 Lumbago with sciatica, left side: Secondary | ICD-10-CM

## 2017-04-10 DIAGNOSIS — M6281 Muscle weakness (generalized): Secondary | ICD-10-CM | POA: Diagnosis not present

## 2017-04-10 NOTE — Therapy (Signed)
[facility] [place] [address] [place], [place], [address] Phone: [phone]   Fax:  [phone]  Physical Therapy Treatment  Patient Details  Name: Suzanne Stephenson MRN: [id number] Date of Birth: 08/07/1985 Referring Provider: Dr. Clementeen Graham   Encounter Date: 04/10/2017      PT End of Session - 04/10/17 1106    Visit Number 4   Number of Visits 8   Date for PT Re-Evaluation 04/21/17   PT Start Time 1106   PT Stop Time 1202   PT Time Calculation (min) 56 min   Activity Tolerance Patient tolerated treatment well      Past Medical History:  Diagnosis Date  . Anxiety   . Depression   . Headache(784.0)     No past surgical history on file.  There were no vitals filed for this visit.      Subjective Assessment - 04/10/17 1109    Subjective Pt reports she has been doing her HEP however has also been really busy.  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  Her low back is doing better and feeling stronger, now her neck is really bothering her.  She also thinks she has an ovarian cyst - having an ultrasound.    Patient Stated Goals strengthen her back and get rid of pain   Currently in Pain? Yes   Pain Score 3    Pain Location Neck   Pain Orientation Right;Left   Pain Descriptors / Indicators Tightness;Aching   Pain Type Acute pain   Pain Onset More than a month ago   Pain Frequency Intermittent   Aggravating Factors  leaning forward and working.    Pain Relieving Factors heat and rest            [facility] PT Assessment - 04/10/17 0001      Assessment   Medical Diagnosis Acute Lt low back pain     AROM   AROM Assessment Site Lumbar;Cervical   Cervical Flexion 60, twinge in neck   Cervical Extension 74   Cervical - Right Rotation 52 with pain,    Cervical - Left Rotation 45 with pain   Lumbar - Right Rotation 50% present, no pain   Lumbar - Left Rotation WNL                      [facility] Adult PT Treatment/Exercise - 04/10/17 0001      Lumbar Exercises: Aerobic   Elliptical Nustep L4x5'  arms and legs     Lumbar Exercises: Supine   Other Supine Lumbar Exercises head presses & cervical rotations     Modalities   Modalities Moist Heat;Electrical Stimulation     Moist Heat Therapy   Number Minutes Moist Heat 15 Minutes   Moist Heat Location --  bilat upper traps and scalenes     Electrical Stimulation   Electrical Stimulation Location --  bilat scalenes and upper traps   Electrical Stimulation Action pre mod   Electrical Stimulation Parameters to tolerance   Electrical Stimulation Goals Tone;Pain     Manual Therapy   Manual Therapy Joint mobilization;Soft tissue mobilization   Joint Mobilization grade II AP mobs to C5 & C 7   Soft tissue mobilization bilat upper traps and scalenes          Trigger Point Dry Needling - 04/10/17 1118    Consent Given? Yes   Education Handout Provided No   Muscles Treated  Upper Body Upper trapezius  scalenes bilat sides, Rt side a lot more reactive than Lt   Upper Trapezius Response Twitch reponse elicited;Palpable increased muscle length  bilat Rt tighter than Lt                    PT Long Term Goals - 04/10/17 1111      PT LONG TERM GOAL #1   Title I with advanced HEP ( 04/21/17)    Status On-going     PT LONG TERM GOAL #2   Title report =/> 75% reduction in low back pain with daily activities ( 04/21/17)    Status On-going  pt reports 50% improvedment     PT LONG TERM GOAL #3   Title improve FOTO =/< 27% limited ( 04/21/17)    Status On-going     PT LONG TERM GOAL #4   Title demo strong contraction of bilat multifidi ( 04/21/17)    Status On-going               Plan - 04/10/17 1247    Clinical Impression Statement Pt has been using her TENs at home, 50% reported improvement in her low back symptoms.  Her neck is really bothering her today. she has  tightness and limited motion.  Responded well to manual work with decreased tightness after session.  Making progress to her goal.s    Rehab Potential Excellent   PT Frequency 2x / week   PT Treatment/Interventions Moist Heat;Ultrasound;Therapeutic exercise;Dry needling;Manual techniques;Electrical Stimulation;Cryotherapy;Patient/family education   PT Next Visit Plan Assess tightness of Rt/Lt hip. DN.  Progress spinal stabilization.    Consulted and Agree with Plan of Care Patient      Patient will benefit from skilled therapeutic intervention in order to improve the following deficits and impairments:  Pain, Decreased strength, Decreased activity tolerance, Hypermobility  Visit Diagnosis: Muscle weakness (generalized)  Acute left-sided low back pain with left-sided sciatica     Problem List Patient Active Problem List   Diagnosis Date Noted  . Lumbago with sciatica, left side 02/28/2017  . Headache 11/26/2015  . Major depressive disorder, recurrent episode, moderate ([facility]) 01/14/2014  . Anxiety and depression 01/14/2014    Roderic Scarce PT  04/10/2017, 12:49 PM  [facility] [address] [place], [place], [address] Phone: [phone]   Fax:  [phone]  Name: Brennan Karam MRN: [id number] Date of Birth: 07-03-1985

## 2017-04-11 DIAGNOSIS — N938 Other specified abnormal uterine and vaginal bleeding: Secondary | ICD-10-CM | POA: Diagnosis not present

## 2017-04-11 DIAGNOSIS — R102 Pelvic and perineal pain: Secondary | ICD-10-CM | POA: Diagnosis not present

## 2017-04-17 ENCOUNTER — Encounter: Payer: Self-pay | Admitting: Rehabilitative and Restorative Service Providers"

## 2017-04-17 DIAGNOSIS — F900 Attention-deficit hyperactivity disorder, predominantly inattentive type: Secondary | ICD-10-CM | POA: Diagnosis not present

## 2017-05-05 ENCOUNTER — Ambulatory Visit: Payer: BLUE CROSS/BLUE SHIELD | Admitting: Physical Therapy

## 2017-05-05 DIAGNOSIS — M6281 Muscle weakness (generalized): Secondary | ICD-10-CM | POA: Diagnosis not present

## 2017-05-05 DIAGNOSIS — M5442 Lumbago with sciatica, left side: Secondary | ICD-10-CM

## 2017-05-05 NOTE — Therapy (Addendum)
[place], [place], [address] Phone: [phone]   Fax:  [phone]  Physical Therapy Treatment  Patient Details  Name: Suzanne Stephenson MRN: [id number] Date of Birth: 1985-06-28 Referring Provider: Dr. Georgina Snell    Encounter Date: 05/05/2017  PT End of Session - 05/05/17 1544    Visit Number  5    Number of Visits  8    PT Start Time  4656    PT Stop Time  1623    PT Time Calculation (min)  50 min    Activity Tolerance  Patient tolerated treatment well    Behavior During Therapy  [facility] for tasks assessed/performed       Past Medical History:  Diagnosis Date  . Anxiety   . Depression   . Headache(784.0)     No past surgical history on file.  There were no vitals filed for this visit.  Subjective Assessment - 05/05/17 1532    Subjective  pt has been doing hot yoga flow and this is going well.  She's been busy with work.   She had relief from DN, "it took years away from my shoulders".  It was "brutal" couple days after DN, but resolved ok.  She reports 75% reduction of LBP.    Currently in Pain?  Yes    Pain Score  4     Pain Location  Neck    Pain Orientation  Left;Right    Pain Descriptors / Indicators  Aching    Aggravating Factors   leaning forward for work     Pain Relieving Factors  heat and rest.          [facility] PT Assessment - 05/05/17 0001      Assessment   Medical Diagnosis  Acute Lt low back pain    Referring Provider  Dr. Georgina Snell     Onset Date/Surgical Date  02/07/17    Hand Dominance  Right    Next MD Visit  PRN      Observation/Other Assessments   Focus on Therapeutic Outcomes (FOTO)   26% limited.       Strength   Lumbar Extension  -- multifidi strong Rt, fair (-) Lt       [facility] Adult PT Treatment/Exercise - 05/05/17 0001      Lumbar Exercises: Prone   Opposite Arm/Leg Raise  Right arm/Left leg;Left arm/Right leg;5 reps    Other Prone Lumbar Exercises  pelvic  press with 5 sec holds x 8 reps       Shoulder Exercises: ROM/Strengthening   UBE (Upper Arm Bike)  L2: 2 min forward/ 2 min backward.       Shoulder Exercises: Stretch   Other Shoulder Stretches  mid and upper level doorway stretch x 30 sec x 2 reps each.       Modalities   Modalities  Electrical Stimulation;Moist Heat      Moist Heat Therapy   Number Minutes Moist Heat  15 Minutes    Moist Heat Location  Lumbar Spine;Cervical      Electrical Stimulation   Electrical Stimulation Location  bilat upper trap and cervical paraspinals    Electrical Stimulation Action  IFC    Electrical Stimulation Parameters  to tolerance     Electrical Stimulation Goals  Tone;Pain      Manual Therapy   Soft tissue mobilization  STM to bilat scalenes, upper trap, cervical paraspinals, suboccipitals.      Neck Exercises: Stretches  Upper Trapezius Stretch  2 reps;30 seconds                  PT Long Term Goals - 05/05/17 1542      PT LONG TERM GOAL #1   Title  I with advanced HEP ( 04/21/17)     Time  6    Period  Weeks    Status  On-going      PT LONG TERM GOAL #2   Title  report =/> 75% reduction in low back pain with daily activities ( 04/21/17)     Time  6    Period  Weeks    Status  Achieved      PT LONG TERM GOAL #3   Title  improve FOTO =/< 27% limited ( 04/21/17)     Time  6    Period  Weeks    Status  Achieved      PT LONG TERM GOAL #4   Title  demo strong contraction of bilat multifidi ( 04/21/17)     Time  6    Period  Weeks    Status  Partially Met            Plan - 05/05/17 1638    Clinical Impression Statement  Pt reporting 75% reduction of LBP; has met LTG #3.  Her FOTO score has improved to 26% limited; has met LTG#4.  Her Lt multifidi cont to be slower and weaker to fire.  She is now reporting increased neck pain. Pt will benefit from further evaluation of this area; notified MD and supervising PT of pt's desire to cont therapy with inclusion of  neck.     Rehab Potential  Excellent    PT Frequency  2x / week    PT Duration  6 weeks    PT Treatment/Interventions  Moist Heat;Ultrasound;Therapeutic exercise;Dry needling;Manual techniques;Electrical Stimulation;Cryotherapy;Patient/family education    PT Next Visit Plan  Spoke to supervising PT regarding pt's progress. Will write renewal at next visit- will add in neck at this renewal.    Consulted and Agree with Plan of Care  Patient       Patient will benefit from skilled therapeutic intervention in order to improve the following deficits and impairments:  Pain, Decreased strength, Decreased activity tolerance, Hypermobility  Visit Diagnosis: Muscle weakness (generalized)  Acute left-sided low back pain with left-sided sciatica     Problem List Patient Active Problem List   Diagnosis Date Noted  . Lumbago with sciatica, left side 02/28/2017  . Headache 11/26/2015  . Major depressive disorder, recurrent episode, moderate ([place]) 01/14/2014  . Anxiety and depression 01/14/2014   Kerin Perna, PTA 05/05/17 4:46 PM   Jeral Pinch, PT 05/08/17 6:54 AM  [place] [facility] [place], [place], [address] Phone: [phone]   Fax:  [phone]  Name: Suzanne Stephenson MRN: [id number] Date of Birth: 1986/02/02

## 2017-05-11 ENCOUNTER — Encounter: Payer: Self-pay | Admitting: Physical Therapy

## 2017-05-11 ENCOUNTER — Ambulatory Visit: Payer: BLUE CROSS/BLUE SHIELD | Admitting: Physical Therapy

## 2017-05-11 DIAGNOSIS — M6281 Muscle weakness (generalized): Secondary | ICD-10-CM | POA: Diagnosis not present

## 2017-05-11 DIAGNOSIS — M5442 Lumbago with sciatica, left side: Secondary | ICD-10-CM

## 2017-05-11 DIAGNOSIS — M542 Cervicalgia: Secondary | ICD-10-CM | POA: Diagnosis not present

## 2017-05-11 NOTE — Therapy (Signed)
[facility] [place], [place], [address] Phone: [phone]   Fax:  [phone]  Physical Therapy Treatment  Patient Details  Name: Suzanne Stephenson MRN: [id number] Date of Birth: 1985/12/18 Referring Provider: Dr Georgina Snell   Encounter Date: 05/11/2017  PT End of Session - 05/11/17 1403    Visit Number  6    Number of Visits  11    Date for PT Re-Evaluation  06/16/17    PT Start Time  4680    PT Stop Time  1459    PT Time Calculation (min)  56 min    Activity Tolerance  Patient tolerated treatment well       Past Medical History:  Diagnosis Date  . Anxiety   . Depression   . Headache(784.0)     History reviewed. No pertinent surgical history.  There were no vitals filed for this visit.  Subjective Assessment - 05/11/17 1403    Subjective  Continues with yoga, has modified her poses that are weightbearing through her UE     Patient Stated Goals  strengthen her back and get rid of pain    Currently in Pain?  Yes    Pain Score  2     Pain Location  Neck    Pain Orientation  Left;Right Rt is worse    Pain Descriptors / Indicators  Tightness;Aching    Pain Type  Acute pain    Pain Onset  More than a month ago    Pain Frequency  Intermittent    Aggravating Factors   holding arms up at work    Pain Relieving Factors  heat and rest, DN         [facility] PT Assessment - 05/11/17 0001      Assessment   Medical Diagnosis  Acute Lt low back pain and neck pain    Referring Provider  Dr Georgina Snell      AROM   Cervical Flexion  65    Cervical Extension  75    Cervical - Right Rotation  70 some tightness    Cervical - Left Rotation  70    Lumbar Flexion  WNL to floor    Lumbar Extension  WNL    Lumbar - Right Side Bend  WNL    Lumbar - Left Side Bend  WNL    Lumbar - Right Rotation  WNL    Lumbar - Left Rotation  WNL, no pain some tightness.                   [facility] Adult PT Treatment/Exercise  - 05/11/17 0001      Lumbar Exercises: Supine   Other Supine Lumbar Exercises  15 reps, red band, overhead, horizontal abduct, ER , SASH      Modalities   Modalities  Electrical Stimulation;Moist Heat      Moist Heat Therapy   Number Minutes Moist Heat  15 Minutes    Moist Heat Location  Cervical;Lumbar Spine      Electrical Stimulation   Electrical Stimulation Location  bilat upper trap and cervical paraspinals    Electrical Stimulation Action  IFC    Electrical Stimulation Parameters  to tolerance    Electrical Stimulation Goals  Tone;Pain      Manual Therapy   Soft tissue mobilization  STM to bilat upper trap, cervical paraspinals, suboccipitals.       Trigger Point Dry Needling - 05/11/17 1412    Consent Given?  Yes    Education Handout Provided  No    Muscles Treated Upper Body  Upper trapezius;Levator scapulae;Subscapularis           PT Education - 05/11/17 1410    Education provided  Yes    Education Details  HEP    Person(s) Educated  Patient    Methods  Explanation;Demonstration;Handout    Comprehension  Verbalized understanding;Returned demonstration          PT Long Term Goals - 05/11/17 1405      PT LONG TERM GOAL #1   Title  I with advanced HEP ( 06/16/17)     Time  6    Period  Weeks    Status  On-going      PT LONG TERM GOAL #2   Title  report =/> 75% reduction in low back pain with daily activities ( 04/21/17)     Status  Achieved      PT LONG TERM GOAL #3   Title  improve FOTO =/< 27% limited ( 04/21/17)     Status  Achieved      PT LONG TERM GOAL #4   Title  demo strong contraction of bilat multifidi ( 06/16/17)     Time  6    Period  Weeks    Status  Partially Met      PT LONG TERM GOAL #5   Title  no more than 2/10 pain in her neck after a heavy client day at work ( 06/16/17)     Time  [place] - 05/11/17 1413    Clinical Impression Statement  Conni is making great  progress as relates to her low back pain, greater than 75% improved.  She is more concerned about her neck and upper shoulder pain and tightness at this time.  She has multiple areas of tightness and trigger points and would benefit from continued therapy to address this.  Her job places a lot of stress on her upper shoulders and neck.     Rehab Potential  Excellent    PT Frequency  1x / week    PT Duration  6 weeks    PT Treatment/Interventions  Moist Heat;Ultrasound;Therapeutic exercise;Dry needling;Manual techniques;Electrical Stimulation;Cryotherapy;Patient/family education    PT Next Visit Plan  cont with manual work and DN to neck and low back as needed.     Consulted and Agree with Plan of Care  Patient       Patient will benefit from skilled therapeutic intervention in order to improve the following deficits and impairments:  Pain, Decreased strength, Decreased activity tolerance, Hypermobility  Visit Diagnosis: Muscle weakness (generalized) - Plan: PT plan of care cert/re-cert  Acute left-sided low back pain with left-sided sciatica - Plan: PT plan of care cert/re-cert  Cervicalgia - Plan: PT plan of care cert/re-cert     Problem List Patient Active Problem List   Diagnosis Date Noted  . Lumbago with sciatica, left side 02/28/2017  . Headache 11/26/2015  . Major depressive disorder, recurrent episode, moderate ([place]) 01/14/2014  . Anxiety and depression 01/14/2014    Jeral Pinch PT  05/11/2017, 4:35 PM  [facility] [place], [place], [address] Phone: [phone]   Fax:  [phone]  Name: Suzanne Stephenson MRN: [id number] Date of Birth: [birth year]

## 2017-05-11 NOTE — Patient Instructions (Signed)
Over Head Pull: Narrow Grip   [place] [phone], Perform about 3-4 times a week.    On back, knees bent, feet flat, band across thighs, elbows straight but relaxed. Pull hands apart (start). Keeping elbows straight, bring arms up and over head, hands toward floor. Keep pull steady on band. Hold momentarily. Return slowly, keeping pull steady, back to start. Repeat _15-20__ times. Band color __red____   Side Pull: Double Arm   On back, knees bent, feet flat. Arms perpendicular to body, shoulder level, elbows straight but relaxed. Pull arms out to sides, elbows straight. Resistance band comes across collarbones, hands toward floor. Hold momentarily. Slowly return to starting position. Repeat _15-20__ times. Band color __red___   Elisabeth CaraSash   On back, knees bent, feet flat, left hand on left hip, right hand above left. Pull right arm DIAGONALLY (hip to shoulder) across chest. Bring right arm along head toward floor. Hold momentarily. Slowly return to starting position. Repeat _15-20__ times. Do with left arm. Band color __red____   Shoulder Rotation: Double Arm   On back, knees bent, feet flat, elbows tucked at sides, bent 90, hands palms up. Pull hands apart and down toward floor, keeping elbows near sides. Hold momentarily. Slowly return to starting position. Repeat _15-20__ times. Band color __red____

## 2017-05-22 ENCOUNTER — Encounter: Payer: Self-pay | Admitting: Physical Therapy

## 2017-05-22 ENCOUNTER — Ambulatory Visit: Payer: BLUE CROSS/BLUE SHIELD | Admitting: Physical Therapy

## 2017-05-22 DIAGNOSIS — M6281 Muscle weakness (generalized): Secondary | ICD-10-CM

## 2017-05-22 DIAGNOSIS — M542 Cervicalgia: Secondary | ICD-10-CM

## 2017-05-22 DIAGNOSIS — M5442 Lumbago with sciatica, left side: Secondary | ICD-10-CM | POA: Diagnosis not present

## 2017-05-22 NOTE — Therapy (Signed)
Suzanne Stephenson [place], [place], [address] Phone: [phone]   Fax:  [phone]  Physical Therapy Treatment  Patient Details  Name: Suzanne Stephenson MRN: [id number] Date of Birth: [birth year] Referring Provider: Dr Georgina Snell   Encounter Date: 05/22/2017  PT End of Session - 05/22/17 1109    Visit Number  7    Number of Visits  11    Date for PT Re-Evaluation  06/16/17    PT Start Time  1109    PT Stop Time  1203    PT Time Calculation (min)  54 min    Activity Tolerance  Patient tolerated treatment well       Past Medical History:  Diagnosis Date  . Anxiety   . Depression   . Headache(784.0)     History reviewed. No pertinent surgical history.  There were no vitals filed for this visit.  Subjective Assessment - 05/22/17 1110    Subjective  Pt reports she has been off work for the last couple of days so her neck is feeling good, only a little sore. Felt good after her last visit.      Patient Stated Goals  strengthen her back and get rid of pain    Currently in Pain?  Yes    Pain Score  2     Pain Location  Neck    Pain Orientation  Left;Right    Pain Descriptors / Indicators  Dull    Pain Type  Acute pain    Pain Onset  More than a month ago    Pain Frequency  Intermittent    Aggravating Factors   over use    Pain Relieving Factors  rest, heat , DN         [facility] PT Assessment - 05/22/17 0001      Assessment   Medical Diagnosis  Acute Lt low back pain and neck pain                  [facility] Adult PT Treatment/Exercise - 05/22/17 0001      Exercises   Exercises  Neck      Neck Exercises: Supine   Cervical Isometrics  Extension;20 reps with head on ball off EOB    Other Supine Exercise  4x5 deep neck flexor exercise      Neck Exercises: Prone   Other Prone Exercise  2x10 POE, cervical diagonals.       Lumbar Exercises: Aerobic   UBE (Upper Arm Bike)  standing L2x4' alt  FWD/BWD      Modalities   Modalities  Electrical Stimulation;Moist Heat      Moist Heat Therapy   Number Minutes Moist Heat  15 Minutes    Moist Heat Location  Cervical;Lumbar Spine      Electrical Stimulation   Electrical Stimulation Location  cervical, periocciput    Electrical Stimulation Action  IFC    Electrical Stimulation Parameters  to tolerance    Electrical Stimulation Goals  Tone;Pain      Manual Therapy   Manual Therapy  Myofascial release;Soft tissue mobilization;Joint mobilization    Joint Mobilization  grade II cervical mobs in supine    Soft tissue mobilization  STM to bilat upper traps and cervical paraspinals    Myofascial Release  occipital base release, gentle cervical traction and stretches.        Trigger Point Dry Needling - 05/22/17 1128    Consent Given?  Yes  Education Handout Provided  No    Muscles Treated Upper Body  Oblique capitus;Suboccipitals muscle group    Oblique Capitus Response  Palpable increased muscle length;Twitch response elicited bilat    SubOccipitals Response  Palpable increased muscle length;Twitch response elicited bilat                PT Long Term Goals - 05/11/17 1405      PT LONG TERM GOAL #1   Title  I with advanced HEP ( 06/16/17)     Time  6    Period  Weeks    Status  On-going      PT LONG TERM GOAL #2   Title  report =/> 75% reduction in low back pain with daily activities ( 04/21/17)     Status  Achieved      PT LONG TERM GOAL #3   Title  improve FOTO =/< 27% limited ( 04/21/17)     Status  Achieved      PT LONG TERM GOAL #4   Title  demo strong contraction of bilat multifidi ( 06/16/17)     Time  6    Period  Weeks    Status  Partially Met      PT LONG TERM GOAL #5   Title  no more than 2/10 pain in her neck after a heavy client day at work ( 06/16/17)     Time  6    Period  Weeks    Status  ongoing           Plan - 05/22/17 [place]  Cahterine is doing  well today, however she has not been to work in 4 days. Her cervical motion was good today and she had less palpable tightness in the cervical paraspinals.  She is still tight periocciput, this improved after treatment.  She will return to work tomorow.     Rehab Potential  Excellent    PT Frequency  1x / week    PT Duration  6 weeks    PT Treatment/Interventions  Moist Heat;Ultrasound;Therapeutic exercise;Dry needling;Manual techniques;Electrical Stimulation;Cryotherapy;Patient/family education    PT Next Visit Plan  cont with manual work and DN to neck and low back as needed.     Consulted and Agree with Plan of Care  Patient       Patient will benefit from skilled therapeutic intervention in order to improve the following deficits and impairments:  Pain, Decreased strength, Decreased activity tolerance, Hypermobility  Visit Diagnosis: Muscle weakness (generalized)  Acute left-sided low back pain with left-sided sciatica  Cervicalgia     Problem List Patient Active Problem List   Diagnosis Date Noted  . Lumbago with sciatica, left side 02/28/2017  . Headache 11/26/2015  . Major depressive disorder, recurrent episode, moderate ([place]) 01/14/2014  . Anxiety and depression 01/14/2014    Suzanne Stephenson PT  05/22/2017, 12:19 PM  [facility] [place], [place], [address] Phone: [phone]   Fax:  [phone]  Name: Suzanne Stephenson MRN: [id number] Date of Birth: [birth year]

## 2017-05-30 ENCOUNTER — Encounter: Payer: Self-pay | Admitting: Physical Therapy

## 2017-05-30 ENCOUNTER — Ambulatory Visit: Payer: BLUE CROSS/BLUE SHIELD | Admitting: Physical Therapy

## 2017-05-30 DIAGNOSIS — M542 Cervicalgia: Secondary | ICD-10-CM

## 2017-05-30 DIAGNOSIS — M6281 Muscle weakness (generalized): Secondary | ICD-10-CM

## 2017-05-30 DIAGNOSIS — M5442 Lumbago with sciatica, left side: Secondary | ICD-10-CM | POA: Diagnosis not present

## 2017-05-30 NOTE — Therapy (Addendum)
[place], [place], [address] Phone: [phone]   Fax:  [phone]  Physical Therapy Treatment  Patient Details  Name: Suzanne Stephenson MRN: [id number] Date of Birth: Feb 04, 1986 Referring Provider: Dr Georgina Snell   Encounter Date: 05/30/2017  PT End of Session - 05/30/17 1103    Visit Number  8    Number of Visits  11    Date for PT Re-Evaluation  06/16/17    PT Start Time  1103    PT Stop Time  1146    PT Time Calculation (min)  43 min    Activity Tolerance  Patient tolerated treatment well       Past Medical History:  Diagnosis Date  . Anxiety   . Depression   . Headache(784.0)     History reviewed. No pertinent surgical history.  There were no vitals filed for this visit.  Subjective Assessment - 05/30/17 1103    Subjective  Pt reports she is feeling better still even with returning to work. No pain, only some tightness in anterior neck.     Patient Stated Goals  strengthen her back and get rid of pain    Currently in Pain?  No/denies         [facility] PT Assessment - 05/30/17 0001      Assessment   Medical Diagnosis  Acute Lt low back pain and neck pain    Referring Provider  Dr Georgina Snell    Onset Date/Surgical Date  02/07/17      AROM   AROM Assessment Site  Cervical    Cervical Flexion  65    Cervical Extension  74    Cervical - Right Side Bend  52    Cervical - Left Side Bend  46    Cervical - Right Rotation  82    Cervical - Left Rotation  88                  [facility] Adult PT Treatment/Exercise - 05/30/17 0001      Lumbar Exercises: Aerobic   UBE (Upper Arm Bike)  standing L2x4' alt FWD/BWD      Modalities   Modalities  Electrical Stimulation;Moist Heat      Moist Heat Therapy   Number Minutes Moist Heat  15 Minutes    Moist Heat Location  Cervical;Lumbar Spine      Electrical Stimulation   Electrical Stimulation Location  bilat SCM    Electrical  Stimulation Action  premod    Electrical Stimulation Parameters  to tolerance    Electrical Stimulation Goals  Tone;Pain      Manual Therapy   Manual Therapy  Soft tissue mobilization    Soft tissue mobilization  STM to bilat SCM and sub occitpals  in supine       Neck Exercises: Stretches   Other Neck Stretches  20 sec SCM stretches       Trigger Point Dry Needling - 05/30/17 1106    Consent Given?  Yes    Education Handout Provided  No    Muscles Treated Upper Body  Sternocleidomastoid;Suboccipitals muscle group    Sternocleidomastoid Response  Palpable increased muscle length;Twitch response elicited bilat    SubOccipitals Response  Palpable increased muscle length;Twitch response elicited bilat                PT Long Term Goals - 05/30/17 1105      PT LONG TERM GOAL #1   Title  I with advanced HEP ( 06/16/17)     Status  On-going      PT LONG TERM GOAL #2   Title  report =/> 75% reduction in low back pain with daily activities ( 04/21/17)     Status  Achieved      PT LONG TERM GOAL #3   Title  improve FOTO =/< 27% limited ( 04/21/17)     Status  Achieved      PT LONG TERM GOAL #4   Title  demo strong contraction of bilat multifidi ( 06/16/17)     Status  Partially Met      PT LONG TERM GOAL #5   Title  no more than 2/10 pain in her neck after a heavy client day at work ( 06/16/17)     Status  Achieved 2/10 at end of a busy day per patient            Plan - 05/30/17 1134    Clinical Impression Statement  Pt demo's increased cervical ROM with no pain only slight tightness.  She has met almost all her goals.  She had good release of her SCM with slight tightness in her perioccipital muscles.  She is nearing discharge.  No complaints of low back issues.     Rehab Potential  Excellent    PT Frequency  1x / week    PT Duration  6 weeks    PT Treatment/Interventions  Moist Heat;Ultrasound;Therapeutic exercise;Dry needling;Manual techniques;Electrical  Stimulation;Cryotherapy;Patient/family education    PT Next Visit Plan  FOTO and assess for discharge     Consulted and Agree with Plan of Care  Patient       Patient will benefit from skilled therapeutic intervention in order to improve the following deficits and impairments:  Pain, Decreased strength, Decreased activity tolerance, Hypermobility  Visit Diagnosis: Muscle weakness (generalized)  Acute left-sided low back pain with left-sided sciatica  Cervicalgia     Problem List Patient Active Problem List   Diagnosis Date Noted  . Lumbago with sciatica, left side 02/28/2017  . Headache 11/26/2015  . Major depressive disorder, recurrent episode, moderate ([place]) 01/14/2014  . Anxiety and depression 01/14/2014    Jeral Pinch PT 05/30/2017, 11:37 AM  [facility] [place], [place], [address] Phone: [phone]   Fax:  [phone]  Name: Suzanne Stephenson MRN: [id number] Date of Birth: Oct 19, 1985   PHYSICAL THERAPY DISCHARGE SUMMARY  Visits from Start of Care: 8  Current functional level related to goals / functional outcomes: Unknown for current, she was doing well and was going to be accessed for discharge at her next visit   Remaining deficits: unknown   Education / Equipment: HEP Plan:                                                    Patient goals were partially met. Patient is being discharged due to not returning since the last visit.  ?????    Jeral Pinch, PT 07/24/17 9:21 AM

## 2017-07-17 DIAGNOSIS — F900 Attention-deficit hyperactivity disorder, predominantly inattentive type: Secondary | ICD-10-CM | POA: Diagnosis not present

## 2017-07-26 IMAGING — CR DG CHEST 2V
2 series · 2 of 2 positions shown · non-contrast
Comparison: None.

CLINICAL DATA: Cough for 1 week.  Chest tightness.

EXAM:
CHEST  2 VIEW

[chest pa]
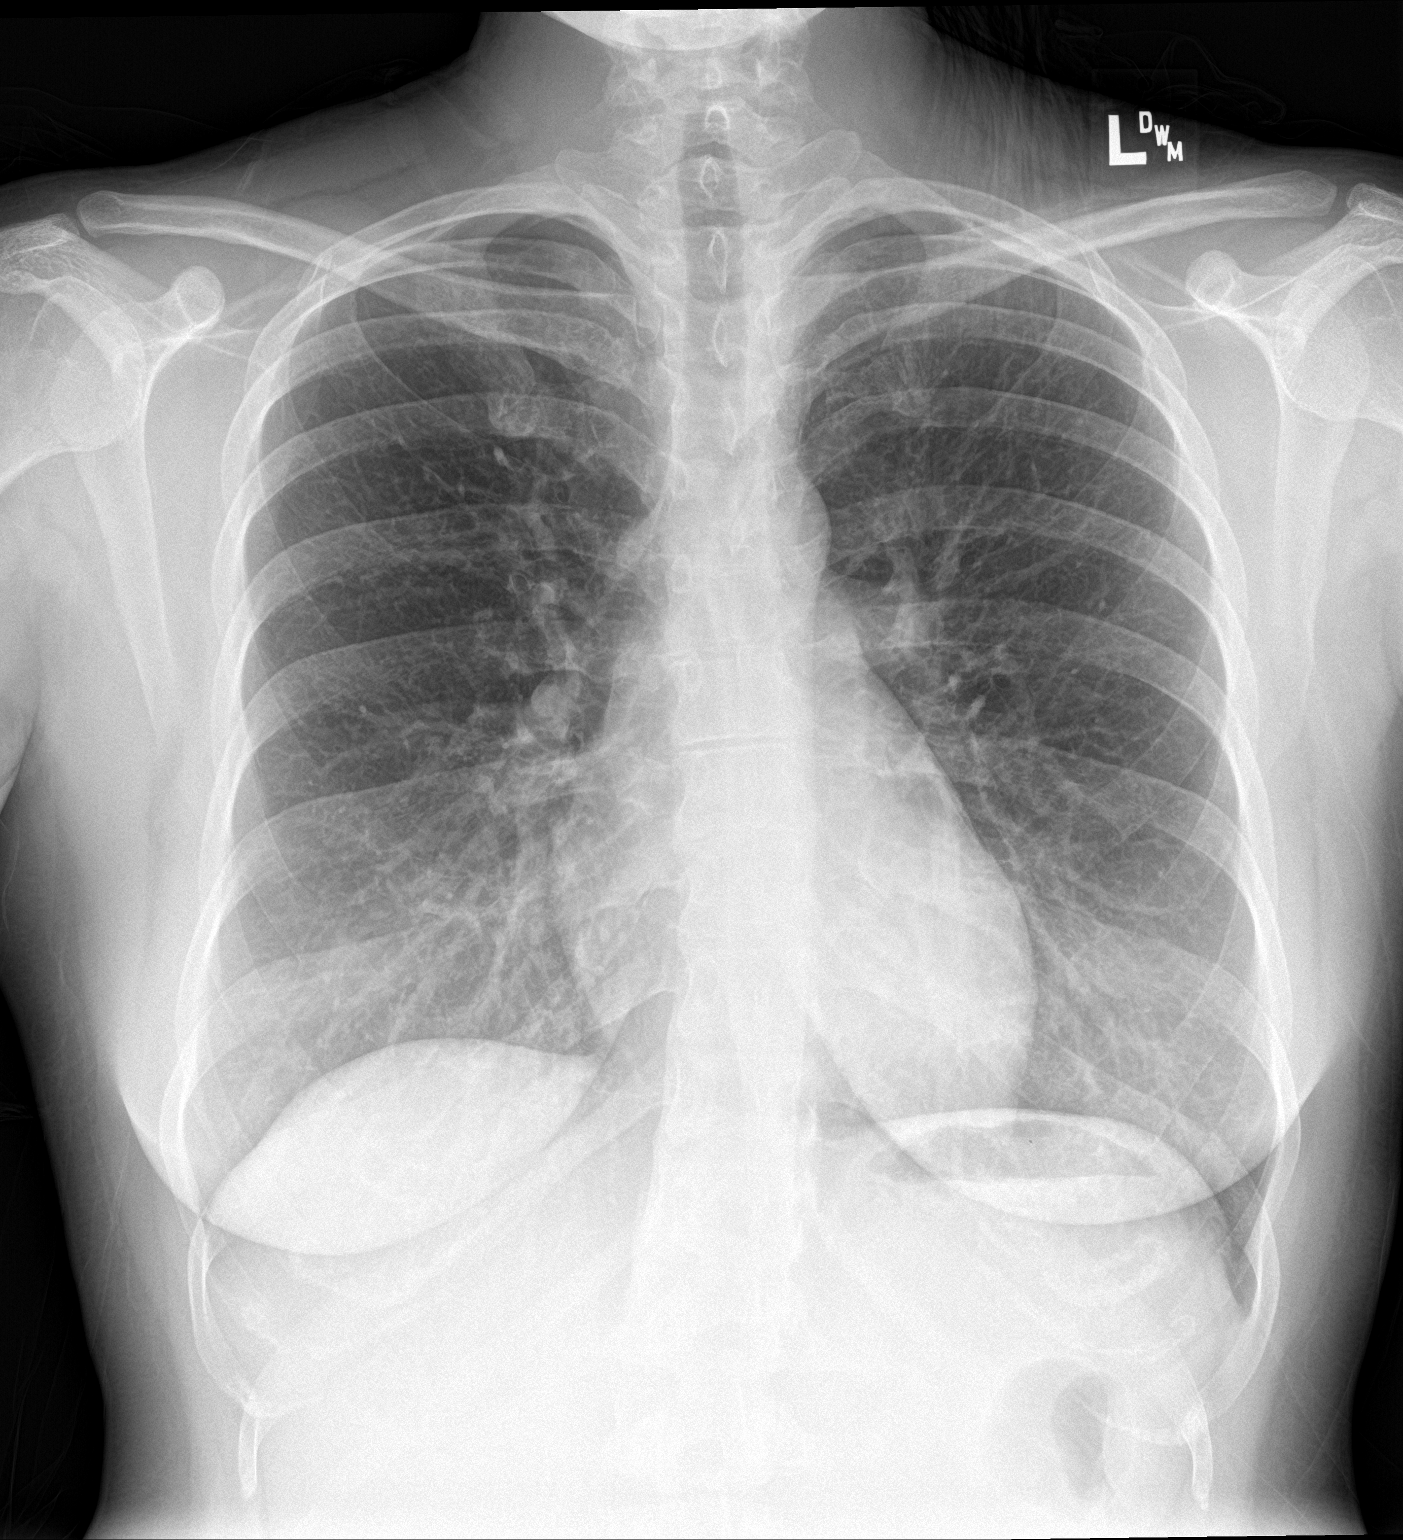

[chest lat]
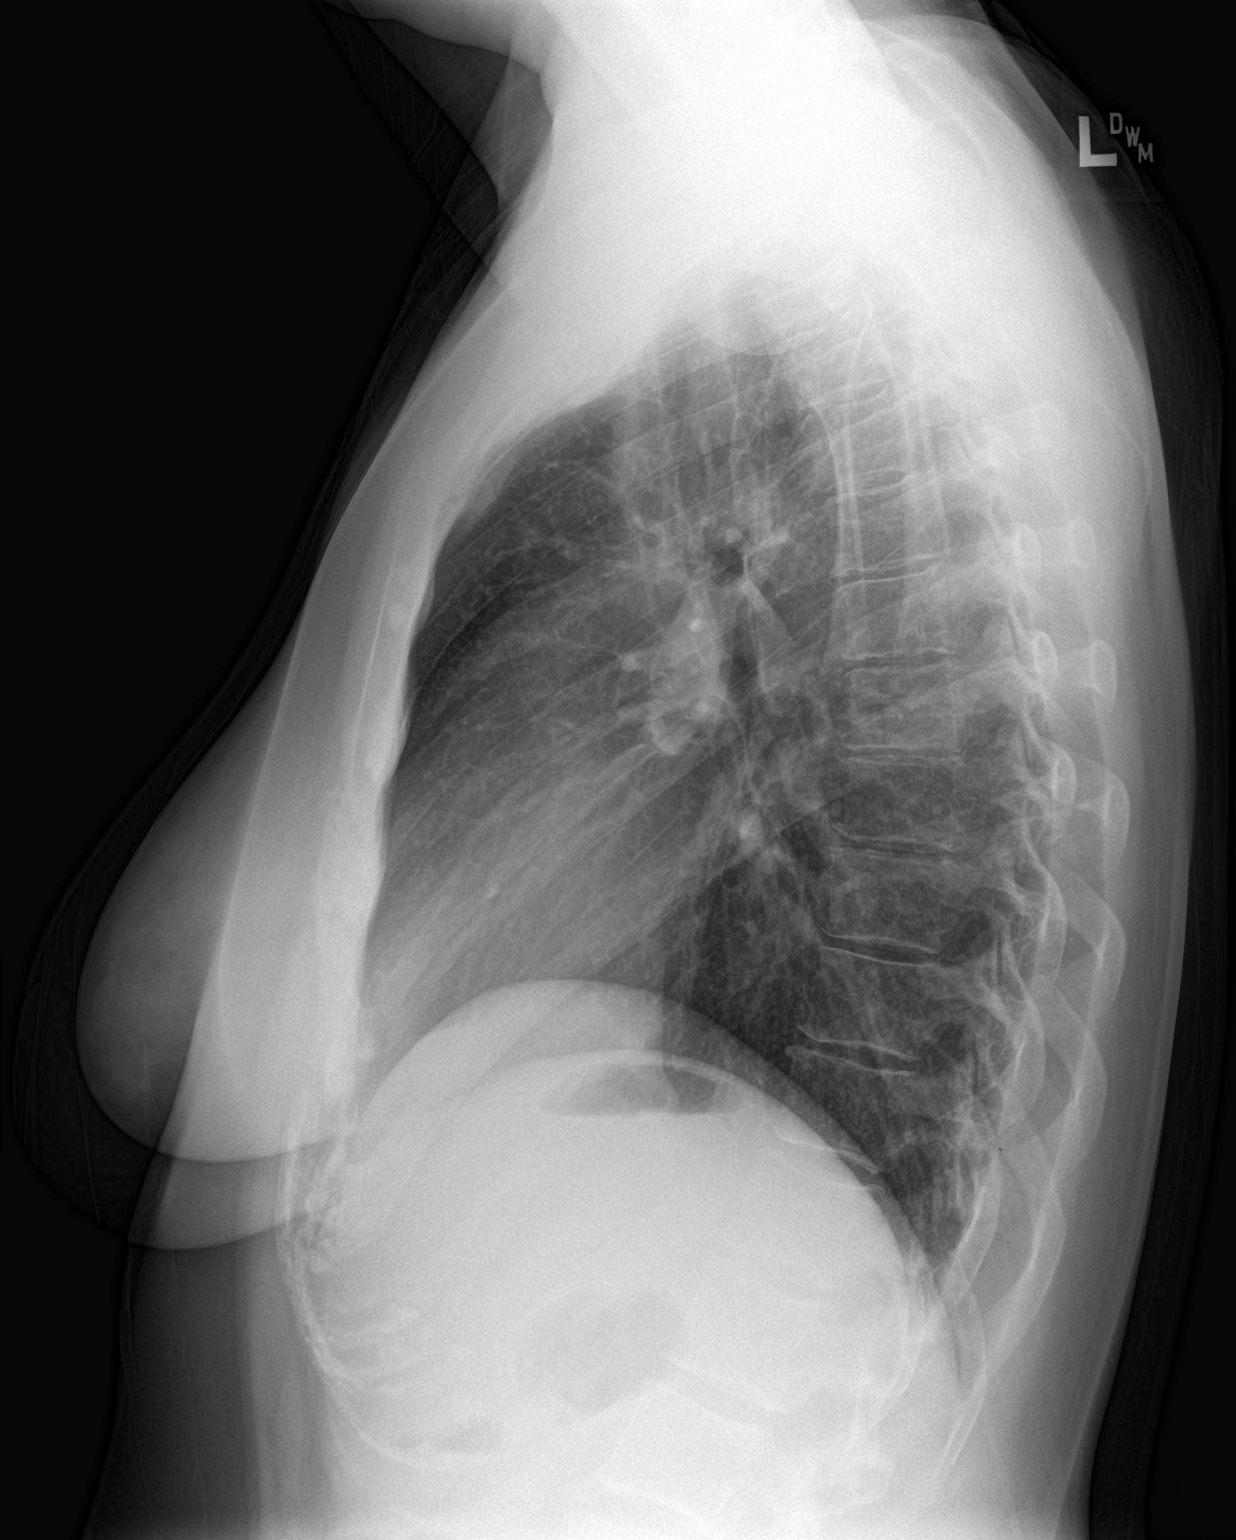

[2 of 2 positions shown; findings below may reference images not displayed]

FINDINGS: Lungs are clear. Heart size and pulmonary vascularity are normal. No
adenopathy. No pneumothorax. There is mild mid thoracic
levoscoliosis.
IMPRESSION: No edema or consolidation.

## 2017-08-16 DIAGNOSIS — J0110 Acute frontal sinusitis, unspecified: Secondary | ICD-10-CM | POA: Diagnosis not present

## 2017-08-16 DIAGNOSIS — M791 Myalgia, unspecified site: Secondary | ICD-10-CM | POA: Diagnosis not present

## 2017-08-16 DIAGNOSIS — Z792 Long term (current) use of antibiotics: Secondary | ICD-10-CM | POA: Diagnosis not present

## 2017-10-09 DIAGNOSIS — F900 Attention-deficit hyperactivity disorder, predominantly inattentive type: Secondary | ICD-10-CM | POA: Diagnosis not present

## 2017-10-12 ENCOUNTER — Ambulatory Visit: Payer: Self-pay | Admitting: Family Medicine

## 2017-10-19 ENCOUNTER — Ambulatory Visit: Payer: Self-pay | Admitting: Family Medicine

## 2018-01-08 DIAGNOSIS — F900 Attention-deficit hyperactivity disorder, predominantly inattentive type: Secondary | ICD-10-CM | POA: Diagnosis not present

## 2018-02-14 LAB — HM HIV SCREENING LAB: HM HIV SCREENING: NEGATIVE

## 2018-02-15 DIAGNOSIS — J069 Acute upper respiratory infection, unspecified: Secondary | ICD-10-CM | POA: Diagnosis not present

## 2018-02-15 DIAGNOSIS — J029 Acute pharyngitis, unspecified: Secondary | ICD-10-CM | POA: Diagnosis not present

## 2018-02-21 DIAGNOSIS — Z3401 Encounter for supervision of normal first pregnancy, first trimester: Secondary | ICD-10-CM | POA: Diagnosis not present

## 2018-03-01 DIAGNOSIS — O365911 Maternal care for other known or suspected poor fetal growth, first trimester, fetus 1: Secondary | ICD-10-CM | POA: Diagnosis not present

## 2018-03-29 DIAGNOSIS — Z3480 Encounter for supervision of other normal pregnancy, unspecified trimester: Secondary | ICD-10-CM | POA: Diagnosis not present

## 2018-03-29 DIAGNOSIS — Z3682 Encounter for antenatal screening for nuchal translucency: Secondary | ICD-10-CM | POA: Diagnosis not present

## 2018-03-29 DIAGNOSIS — Z36 Encounter for antenatal screening for chromosomal anomalies: Secondary | ICD-10-CM | POA: Diagnosis not present

## 2018-04-09 DIAGNOSIS — F900 Attention-deficit hyperactivity disorder, predominantly inattentive type: Secondary | ICD-10-CM | POA: Diagnosis not present

## 2018-04-26 DIAGNOSIS — Z363 Encounter for antenatal screening for malformations: Secondary | ICD-10-CM | POA: Diagnosis not present

## 2018-04-26 DIAGNOSIS — Z3402 Encounter for supervision of normal first pregnancy, second trimester: Secondary | ICD-10-CM | POA: Diagnosis not present

## 2018-05-21 DIAGNOSIS — Z363 Encounter for antenatal screening for malformations: Secondary | ICD-10-CM | POA: Diagnosis not present

## 2018-07-05 ENCOUNTER — Encounter: Payer: Self-pay | Admitting: Osteopathic Medicine

## 2018-07-05 LAB — GC/CHLAMYDIA PROBE AMP, GENITAL: GC/CHLAMYDIA PROBE AMP (~~LOC~~): NEGATIVE

## 2018-07-09 DIAGNOSIS — F900 Attention-deficit hyperactivity disorder, predominantly inattentive type: Secondary | ICD-10-CM | POA: Diagnosis not present

## 2018-07-16 DIAGNOSIS — O99012 Anemia complicating pregnancy, second trimester: Secondary | ICD-10-CM | POA: Diagnosis not present

## 2018-07-16 DIAGNOSIS — Z3402 Encounter for supervision of normal first pregnancy, second trimester: Secondary | ICD-10-CM | POA: Diagnosis not present

## 2018-07-28 DIAGNOSIS — Z87891 Personal history of nicotine dependence: Secondary | ICD-10-CM | POA: Diagnosis not present

## 2018-07-28 DIAGNOSIS — F909 Attention-deficit hyperactivity disorder, unspecified type: Secondary | ICD-10-CM | POA: Diagnosis not present

## 2018-07-28 DIAGNOSIS — M791 Myalgia, unspecified site: Secondary | ICD-10-CM | POA: Diagnosis not present

## 2018-07-28 DIAGNOSIS — Z3A32 32 weeks gestation of pregnancy: Secondary | ICD-10-CM | POA: Diagnosis not present

## 2018-07-28 DIAGNOSIS — O99513 Diseases of the respiratory system complicating pregnancy, third trimester: Secondary | ICD-10-CM | POA: Diagnosis not present

## 2018-07-28 DIAGNOSIS — Z79899 Other long term (current) drug therapy: Secondary | ICD-10-CM | POA: Diagnosis not present

## 2018-07-28 DIAGNOSIS — O99519 Diseases of the respiratory system complicating pregnancy, unspecified trimester: Secondary | ICD-10-CM | POA: Diagnosis not present

## 2018-07-28 DIAGNOSIS — O99343 Other mental disorders complicating pregnancy, third trimester: Secondary | ICD-10-CM | POA: Diagnosis not present

## 2018-07-28 DIAGNOSIS — O99340 Other mental disorders complicating pregnancy, unspecified trimester: Secondary | ICD-10-CM | POA: Diagnosis not present

## 2018-07-28 DIAGNOSIS — R05 Cough: Secondary | ICD-10-CM | POA: Diagnosis not present

## 2018-07-28 DIAGNOSIS — F418 Other specified anxiety disorders: Secondary | ICD-10-CM | POA: Diagnosis not present

## 2018-07-28 DIAGNOSIS — J101 Influenza due to other identified influenza virus with other respiratory manifestations: Secondary | ICD-10-CM | POA: Diagnosis not present

## 2018-07-28 DIAGNOSIS — R0981 Nasal congestion: Secondary | ICD-10-CM | POA: Diagnosis not present

## 2018-08-27 DIAGNOSIS — Z23 Encounter for immunization: Secondary | ICD-10-CM | POA: Diagnosis not present

## 2018-08-27 DIAGNOSIS — R030 Elevated blood-pressure reading, without diagnosis of hypertension: Secondary | ICD-10-CM | POA: Diagnosis not present

## 2018-09-16 DIAGNOSIS — Z3A38 38 weeks gestation of pregnancy: Secondary | ICD-10-CM | POA: Diagnosis not present

## 2018-09-16 DIAGNOSIS — O42013 Preterm premature rupture of membranes, onset of labor within 24 hours of rupture, third trimester: Secondary | ICD-10-CM | POA: Diagnosis not present

## 2018-09-16 DIAGNOSIS — F329 Major depressive disorder, single episode, unspecified: Secondary | ICD-10-CM | POA: Diagnosis not present

## 2018-09-16 DIAGNOSIS — F909 Attention-deficit hyperactivity disorder, unspecified type: Secondary | ICD-10-CM | POA: Diagnosis not present

## 2018-09-16 DIAGNOSIS — O99344 Other mental disorders complicating childbirth: Secondary | ICD-10-CM | POA: Diagnosis not present

## 2018-09-16 DIAGNOSIS — Z23 Encounter for immunization: Secondary | ICD-10-CM | POA: Diagnosis not present

## 2018-09-16 DIAGNOSIS — Z3A36 36 weeks gestation of pregnancy: Secondary | ICD-10-CM | POA: Diagnosis not present

## 2018-09-16 DIAGNOSIS — Z79899 Other long term (current) drug therapy: Secondary | ICD-10-CM | POA: Diagnosis not present

## 2018-09-16 DIAGNOSIS — Z0542 Observation and evaluation of newborn for suspected metabolic condition ruled out: Secondary | ICD-10-CM | POA: Diagnosis not present

## 2018-09-16 DIAGNOSIS — F419 Anxiety disorder, unspecified: Secondary | ICD-10-CM | POA: Diagnosis not present

## 2018-09-16 DIAGNOSIS — Z87891 Personal history of nicotine dependence: Secondary | ICD-10-CM | POA: Diagnosis not present

## 2018-10-01 DIAGNOSIS — F900 Attention-deficit hyperactivity disorder, predominantly inattentive type: Secondary | ICD-10-CM | POA: Diagnosis not present

## 2019-01-17 DIAGNOSIS — F900 Attention-deficit hyperactivity disorder, predominantly inattentive type: Secondary | ICD-10-CM | POA: Diagnosis not present

## 2019-01-30 DIAGNOSIS — R102 Pelvic and perineal pain: Secondary | ICD-10-CM | POA: Diagnosis not present

## 2019-01-30 DIAGNOSIS — N8311 Corpus luteum cyst of right ovary: Secondary | ICD-10-CM | POA: Diagnosis not present

## 2019-01-30 DIAGNOSIS — L293 Anogenital pruritus, unspecified: Secondary | ICD-10-CM | POA: Diagnosis not present
